# Patient Record
Sex: Female | Born: 1985 | Race: Black or African American | Hispanic: No | Marital: Single | Smoking: Current every day smoker
Health system: Southern US, Community
[De-identification: ages and names within clinical notes are randomized; demographics above are authoritative.]

## PROBLEM LIST (undated history)

## (undated) ENCOUNTER — Inpatient Hospital Stay (HOSPITAL_COMMUNITY): Payer: Self-pay

## (undated) DIAGNOSIS — I10 Essential (primary) hypertension: Secondary | ICD-10-CM

## (undated) DIAGNOSIS — D649 Anemia, unspecified: Secondary | ICD-10-CM

## (undated) DIAGNOSIS — IMO0002 Reserved for concepts with insufficient information to code with codable children: Secondary | ICD-10-CM

## (undated) HISTORY — PX: THERAPEUTIC ABORTION: SHX798

---
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
## 2004-05-14 ENCOUNTER — Emergency Department (HOSPITAL_COMMUNITY): Admission: EM | Admit: 2004-05-14 | Discharge: 2004-05-14 | Payer: Self-pay | Admitting: Emergency Medicine

## 2004-06-11 ENCOUNTER — Emergency Department (HOSPITAL_COMMUNITY): Admission: EM | Admit: 2004-06-11 | Discharge: 2004-06-11 | Payer: Self-pay | Admitting: Emergency Medicine

## 2006-08-07 ENCOUNTER — Emergency Department (HOSPITAL_COMMUNITY): Admission: EM | Admit: 2006-08-07 | Discharge: 2006-08-07 | Payer: Self-pay | Admitting: Emergency Medicine

## 2006-09-19 ENCOUNTER — Emergency Department (HOSPITAL_COMMUNITY): Admission: EM | Admit: 2006-09-19 | Discharge: 2006-09-19 | Payer: Self-pay | Admitting: Family Medicine

## 2006-10-20 ENCOUNTER — Emergency Department (HOSPITAL_COMMUNITY): Admission: EM | Admit: 2006-10-20 | Discharge: 2006-10-20 | Payer: Self-pay | Admitting: Family Medicine

## 2007-08-20 ENCOUNTER — Inpatient Hospital Stay (HOSPITAL_COMMUNITY): Admission: AD | Admit: 2007-08-20 | Discharge: 2007-08-20 | Payer: Self-pay | Admitting: Obstetrics and Gynecology

## 2007-08-26 ENCOUNTER — Inpatient Hospital Stay (HOSPITAL_COMMUNITY): Admission: AD | Admit: 2007-08-26 | Discharge: 2007-08-26 | Payer: Self-pay | Admitting: Obstetrics and Gynecology

## 2007-11-08 ENCOUNTER — Emergency Department (HOSPITAL_COMMUNITY): Admission: EM | Admit: 2007-11-08 | Discharge: 2007-11-08 | Payer: Self-pay | Admitting: Family Medicine

## 2008-01-24 ENCOUNTER — Inpatient Hospital Stay (HOSPITAL_COMMUNITY): Admission: AD | Admit: 2008-01-24 | Discharge: 2008-01-24 | Payer: Self-pay | Admitting: Obstetrics and Gynecology

## 2008-04-19 ENCOUNTER — Inpatient Hospital Stay (HOSPITAL_COMMUNITY): Admission: AD | Admit: 2008-04-19 | Discharge: 2008-04-22 | Payer: Self-pay | Admitting: Obstetrics and Gynecology

## 2008-04-19 ENCOUNTER — Encounter (INDEPENDENT_AMBULATORY_CARE_PROVIDER_SITE_OTHER): Payer: Self-pay | Admitting: Obstetrics and Gynecology

## 2008-06-01 ENCOUNTER — Emergency Department (HOSPITAL_COMMUNITY): Admission: EM | Admit: 2008-06-01 | Discharge: 2008-06-01 | Payer: Self-pay | Admitting: Emergency Medicine

## 2008-08-02 ENCOUNTER — Emergency Department (HOSPITAL_COMMUNITY): Admission: EM | Admit: 2008-08-02 | Discharge: 2008-08-02 | Payer: Self-pay | Admitting: Family Medicine

## 2008-09-15 ENCOUNTER — Emergency Department (HOSPITAL_COMMUNITY): Admission: EM | Admit: 2008-09-15 | Discharge: 2008-09-15 | Payer: Self-pay | Admitting: Family Medicine

## 2008-11-20 ENCOUNTER — Emergency Department (HOSPITAL_COMMUNITY): Admission: EM | Admit: 2008-11-20 | Discharge: 2008-11-20 | Payer: Self-pay | Admitting: Emergency Medicine

## 2009-08-22 ENCOUNTER — Emergency Department (HOSPITAL_COMMUNITY): Admission: EM | Admit: 2009-08-22 | Discharge: 2009-08-22 | Payer: Self-pay | Admitting: Emergency Medicine

## 2009-10-14 ENCOUNTER — Emergency Department (HOSPITAL_COMMUNITY): Admission: EM | Admit: 2009-10-14 | Discharge: 2009-10-14 | Payer: Self-pay | Admitting: Family Medicine

## 2010-02-12 ENCOUNTER — Emergency Department (HOSPITAL_COMMUNITY): Admission: EM | Admit: 2010-02-12 | Discharge: 2010-02-12 | Payer: Self-pay | Admitting: Family Medicine

## 2010-03-09 ENCOUNTER — Emergency Department (HOSPITAL_COMMUNITY): Admission: EM | Admit: 2010-03-09 | Discharge: 2010-03-09 | Payer: Self-pay | Admitting: Family Medicine
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
## 2010-07-01 ENCOUNTER — Emergency Department (HOSPITAL_COMMUNITY): Admission: EM | Admit: 2010-07-01 | Discharge: 2010-07-01 | Payer: Self-pay | Admitting: Family Medicine

## 2010-11-19 ENCOUNTER — Emergency Department (HOSPITAL_COMMUNITY)
Admission: EM | Admit: 2010-11-19 | Discharge: 2010-11-19 | Disposition: A | Discharge status: Home / Self Care | Payer: Medicaid Other | Attending: Emergency Medicine | Admitting: Emergency Medicine

## 2010-11-19 DIAGNOSIS — G5690 Unspecified mononeuropathy of unspecified upper limb: Secondary | ICD-10-CM | POA: Insufficient documentation

## 2010-11-19 DIAGNOSIS — R197 Diarrhea, unspecified: Secondary | ICD-10-CM | POA: Insufficient documentation

## 2010-11-19 DIAGNOSIS — K029 Dental caries, unspecified: Secondary | ICD-10-CM | POA: Insufficient documentation

## 2010-11-19 DIAGNOSIS — K089 Disorder of teeth and supporting structures, unspecified: Secondary | ICD-10-CM | POA: Insufficient documentation

## 2010-11-19 DIAGNOSIS — K5289 Other specified noninfective gastroenteritis and colitis: Secondary | ICD-10-CM | POA: Insufficient documentation

## 2010-11-19 DIAGNOSIS — M79609 Pain in unspecified limb: Secondary | ICD-10-CM | POA: Insufficient documentation

## 2010-11-19 DIAGNOSIS — R109 Unspecified abdominal pain: Secondary | ICD-10-CM | POA: Insufficient documentation

## 2010-11-19 DIAGNOSIS — R112 Nausea with vomiting, unspecified: Secondary | ICD-10-CM | POA: Insufficient documentation

## 2010-11-19 DIAGNOSIS — B9789 Other viral agents as the cause of diseases classified elsewhere: Secondary | ICD-10-CM | POA: Insufficient documentation

## 2010-11-19 LAB — URINALYSIS, ROUTINE W REFLEX MICROSCOPIC
Bilirubin Urine: NEGATIVE
Glucose, UA: NEGATIVE mg/dL
Hgb urine dipstick: NEGATIVE
Ketones, ur: 15 mg/dL — AB
Nitrite: NEGATIVE
Protein, ur: NEGATIVE mg/dL
Specific Gravity, Urine: 1.031 — ABNORMAL HIGH (ref 1.005–1.030)
Urobilinogen, UA: 1 mg/dL (ref 0.0–1.0)
pH: 6 (ref 5.0–8.0)

## 2010-11-19 LAB — POCT PREGNANCY, URINE: Preg Test, Ur: NEGATIVE

## 2010-12-19 NOTE — Op Note (Signed)
Christina [name], Christina [name]                 ACCOUNT NO.:  [id number]   MEDICAL RECORD NO.:  [id number]          PATIENT TYPE:  INP   LOCATION:  9104                          FACILITY:  [facility]   PHYSICIAN:  Malachi Pro. Ambrose Mantle, M.D. DATE OF BIRTH:  1985/10/13   DATE OF PROCEDURE:  04/19/2008  DATE OF DISCHARGE:                               OPERATIVE REPORT   PREOPERATIVE DIAGNOSES:  Intrauterine pregnancy 40 plus weeks, failure  to progress in labor.   POSTOPERATIVE DIAGNOSES:  Intrauterine pregnancy 40 plus weeks, failure  to progress in labor with small fibroids on the uterus.   OPERATION:  Low-transverse cervical cesarean section.   OPERATOR:  Malachi Pro. Ambrose Mantle, MD   ANESTHESIA:  Epidural anesthesia.   DESCRIPTION OF PROCEDURE:  The patient was brought to the operating room  and for approximately 20 to 25 minutes we waited for the epidural to set  up.  It never did set up so Dr. Jean Rosenthal came and gave another epidural  and very quickly the patient became anesthetized.  The abdomen was  prepped with Betadine solution and draped as a sterile field.  A Foley  catheter was indwelling.  Anesthesia was confirmed.  A transverse  incision was made and carried in layers through the skin, subcutaneous  tissue, and fascia.  There was bleeding from several vessels in the  subcu tissue as well as in the rectus muscle.  The fascia was incised  transversely, separated from the rectus muscles superiorly and  inferiorly.  The peritoneum was opened vertically and then was enlarged  more by pulling.  The lower uterine segment was exposed.  A small  incision was made in the superficial portion of the myometrium in a  transverse direction, and then, the rest of the way I went with my  finger and I then divided the myometrium upward and outward with the  bandage scissors.  Several loops of cord came out through the incision.  I elevated the vertex through the incisional opening and took a  considerable amount of  fundal pressure and manipulation to get both the  head and the shoulders out, but there was no trauma to the baby.  The  cord was clamped.  The infant was given to Dr. Jiles Crocker who was an  attendance.  He assigned the 8-pound 11-ounce female infant, Apgars of 9  at one and 9 at five minutes.  The placenta was removed.  All the  membranes were removed.  The inside of the uterus was inspected and  found to be free of any remaining debris and the uterine incision was  then closed in layers using a running locked suture of 0 Vicryl on the  first layer, nonlocking suture of the same material on the second layer.  Hemostasis was adequate except for a couple of small spots that required  Bovie and I did take one extra figure-of-eight suture in the middle of  the myometrium.  There was few small less than 1 cm fibroids on the  anterior surface of the uterus.  Both tubes and ovaries appeared  completely  normal.  The gutters were blotted free of blood.  Liberal  irrigation confirmed hemostasis.  The abdominal wall was then closed  with interrupted sutures of 0 Vicryl including  the rectus muscle and the peritoneum then 2 running sutures of 0 Vicryl  in the fascia, running 3-0 Vicryl in the subcu tissue, and staples on  the skin.  The patient seemed to tolerate the procedure well.  Blood  loss was less than 1000 mL.  Sponge and needle counts were correct, and  she was returned to recovery in satisfactory condition.      Malachi Pro. Ambrose Mantle, M.D.  Electronically Signed     TFH/MEDQ  D:  04/20/2008  T:  04/20/2008  Job:  [id number]

## 2010-12-19 NOTE — Discharge Summary (Signed)
Christina [name], Christina [name]                 ACCOUNT NO.:  [id number]   MEDICAL RECORD NO.:  [id number]          PATIENT TYPE:  INP   LOCATION:  9104                          FACILITY:  [facility]   PHYSICIAN:  Malachi Pro. Ambrose Mantle, M.D. DATE OF BIRTH:  04-20-1986   DATE OF ADMISSION:  04/19/2008  DATE OF DISCHARGE:  04/22/2008                               DISCHARGE SUMMARY   This is a 25 year old black single female para 0, gravida 1, admitted  with early labor.  Blood group and type O positive, negative antibody,  sickle cell negative, RPR nonreactive, rubella equivocal.  Hepatitis B  surface antigen negative.  HIV negative.  GC and chlamydia negative.  Quad screen negative.  A 1-hour Glucola 156.  A 3-hour GTT 77, 153, 120,  and 85.  Group B strep positive.  Prenatal course is documented in the  history and physical.  The patient came to the hospital, was placed on  Pitocin at 4 cm dilatation.  She over approximately 10 hours was unable  to progress beyond 4-5 cm and underwent a low-transverse cervical C-  section for failure to progress of with delivery of an 8 pounds 11  ounces female infant.  Apgars were 9 at 9 minutes and 1 at 5 minutes.  Postpartum, the patient did quite well and ambulated well without  difficulty, voided well, tolerated a diet, passed flatus and on the  third postop day was ready for discharge.   Initial hemoglobin 13.6, hematocrit 41.3, white count 7100, platelet  count of 161,000.  Followup hemoglobin 9.9, platelet count of 111,00.  RPR nonreactive.  Urinalysis was negative for protein.  Comprehensive  metabolic profile to check any signs of preeclampsia was negative.  AST  and ALT were 24 and 16 respectively.  LDH was 162.  Uric acid 0.5.   FINAL DIAGNOSES:  Intrauterine pregnancy at 40 weeks, delivered vertex  by C-section for failure to progress in labor.   OPERATION:  Low transverse cervical C-section.   FINAL CONDITION:  Improved.   INSTRUCTIONS:  Our regular  discharge instruction booklet.  The patient  is advised to return to the office in 10-14 days for followup  examination.  She is advised to try to go to the health department and  get a rubella vaccine. Percocet 5/325 mg #30 tablets 1 every 4-6 hours  as needed for pain and Motrin 600 mg #30 tablets 1 every 6 hours as  needed.      Malachi Pro. Ambrose Mantle, M.D.  Electronically Signed     TFH/MEDQ  D:  04/22/2008  T:  04/22/2008  Job:  [id number]

## 2010-12-19 NOTE — H&P (Signed)
Christina [name], Christina [name]                 ACCOUNT NO.:  [id number]   MEDICAL RECORD NO.:  [id number]          PATIENT TYPE:  INP   LOCATION:  9104                          [facility]ACILITY:  [facility]   PHYSICIAN:  Malachi Pro. Ambrose Mantle, M.D. DATE OF BIRTH:  1985-11-29   DATE OF ADMISSION:  04/19/2008  DATE OF DISCHARGE:                              HISTORY & PHYSICAL   A 25 year old black single female, para 0, gravida 1, [facility] April 18, 2008 admitted with early labor.  Blood group and type O positive,  negative antibody, sickle cell negative, RPR nonreactive, rubella  equivocal.  Hepatitis B surface antigen negative, HIV negative, GC and  Chlamydia negative.  Quad screen negative, 1-hour Glucola 156,  3-hour  GTT 77, 153, 120, and 85.  Group B strep positive.  Vaginal ultrasound  on September 17, 2007.  Crown-rump length 2.6 cm, 9 weeks 3 days, [facility]  April 18, 2008.  Ultrasound on November 25, 2007, gestational age [redacted]  weeks 2 days, [facility] April 22, 2008.  Prenatal care was uncomplicated.  At 4:30 a.m. on the day of admission, the patient began having painful  contractions.  They were 5 to 30 minutes apart.  She came to our office  at noon and was examined and found to be 4 cm dilated.  She came to the  hospital, was admitted for contractions were not every 2 minutes ranging  from 2-4 minutes apart so she was started on Pitocin and received an  epidural.   PAST MEDICAL HISTORY:  No known allergies.  No operations.  No  illnesses.   ALCOHOL, TOBACCO AND DRUGS:  One to two cigarettes a day.   FAMILY HISTORY:  Mother with high blood pressure.   PHYSICAL EXAMINATION:  VITAL SIGNS:  On admission, blood pressure  slightly elevated 147/91, temperature 98, respirations 16, pulse 80.  HEART:  Normal sinus rhythm.  No murmurs.  LUNGS:  Lungs were clear to auscultation.  ABDOMEN:  Soft.  Fundal height 42 cm on April 06, 2008.  Fetal heart  tones were normal.  PELVIC:  My exam at 5:55 p.m., the cervix was 5  cm with a bulging bag of  waters, 80% effaced with the vertex minus 5.  Artificial rupture of  membranes produced clear fluid.  Vertex descended to a minus 3.  Now  after 4 hours of good contractions 2 minutes apart with Pitocin at 12  milliunits a minute.  The cervix remains 4 cm, 80% vertex at minus 3.  Will proceed with C-section for failure to progress in labor.  PIH labs  were done on admission and were normal.      Malachi Pro. Ambrose Mantle, M.D.  Electronically Signed     TFH/MEDQ  D:  04/19/2008  T:  04/20/2008  Job:  [id number]

## 2011-04-26 LAB — URINALYSIS, ROUTINE W REFLEX MICROSCOPIC
Glucose, UA: NEGATIVE
Ketones, ur: >80 — AB
Leukocytes, UA: NEGATIVE
Nitrite: NEGATIVE
Protein, ur: 30 — AB
Specific Gravity, Urine: >1.03 — ABNORMAL HIGH
Urobilinogen, UA: 0.2
pH: 6

## 2011-04-26 LAB — URINE MICROSCOPIC-ADD ON

## 2011-04-27 LAB — POCT PREGNANCY, URINE
Operator id: 25114
Preg Test, Ur: POSITIVE

## 2011-04-27 LAB — CBC
HCT: 33.7 — ABNORMAL LOW
Hemoglobin: 11.2 — ABNORMAL LOW
MCHC: 33.3
MCV: 81.3
Platelets: 287
RBC: 4.15
RDW: 19.2 — ABNORMAL HIGH
WBC: 6.9

## 2011-04-27 LAB — GC/CHLAMYDIA PROBE AMP, GENITAL
Chlamydia, DNA Probe: POSITIVE — AB
GC Probe Amp, Genital: NEGATIVE

## 2011-04-27 LAB — WET PREP, GENITAL
Clue Cells Wet Prep HPF POC: NONE SEEN
Trich, Wet Prep: NONE SEEN
Yeast Wet Prep HPF POC: NONE SEEN

## 2011-04-27 LAB — ABO/RH: ABO/RH(D): O POS

## 2011-05-03 LAB — URINALYSIS, ROUTINE W REFLEX MICROSCOPIC
Bilirubin Urine: NEGATIVE
Glucose, UA: NEGATIVE
Hgb urine dipstick: NEGATIVE
Ketones, ur: NEGATIVE
Nitrite: NEGATIVE
Protein, ur: NEGATIVE
Specific Gravity, Urine: 1.025
Urobilinogen, UA: 0.2
pH: 7

## 2011-05-03 LAB — CBC
HCT: 32.8 — ABNORMAL LOW
Hemoglobin: 11.1 — ABNORMAL LOW
MCHC: 33.9
MCV: 92.7
Platelets: 178
RBC: 3.54 — ABNORMAL LOW
RDW: 14.9
WBC: 7.1

## 2011-05-03 LAB — COMPREHENSIVE METABOLIC PANEL
ALT: 15
AST: 19
Albumin: 2.5 — ABNORMAL LOW
Alkaline Phosphatase: 36 — ABNORMAL LOW
BUN: 2 — ABNORMAL LOW
CO2: 25
Calcium: 8.4
Chloride: 106
Creatinine, Ser: 0.4
GFR calc Af Amer: >60
GFR calc non Af Amer: >60
Glucose, Bld: 83
Potassium: 3.7
Sodium: 135
Total Bilirubin: 0.3
Total Protein: 5.5 — ABNORMAL LOW

## 2011-05-03 LAB — URINE MICROSCOPIC-ADD ON

## 2011-05-03 LAB — URIC ACID: Uric Acid, Serum: 3.3

## 2011-05-03 LAB — LACTATE DEHYDROGENASE: LDH: 108

## 2011-05-07 LAB — CBC
HCT: 30 — ABNORMAL LOW
Hemoglobin: 9.9 — ABNORMAL LOW
MCHC: 33.1
MCV: 93.6
Platelets: 111 — ABNORMAL LOW
RBC: 3.2 — ABNORMAL LOW
RDW: 15.2
WBC: 9.3

## 2011-05-09 LAB — COMPREHENSIVE METABOLIC PANEL
AST: 24
Albumin: 2.8 — ABNORMAL LOW
Alkaline Phosphatase: 93
BUN: 4 — ABNORMAL LOW
GFR calc Af Amer: >60
Potassium: 4.4
Total Protein: 6

## 2011-05-09 LAB — CBC
MCV: 92.5
Platelets: 161
WBC: 7.1

## 2011-05-09 LAB — URINALYSIS, DIPSTICK ONLY
Glucose, UA: NEGATIVE
Specific Gravity, Urine: 1.01

## 2011-05-09 LAB — RPR: RPR Ser Ql: NONREACTIVE

## 2011-05-09 LAB — URIC ACID: Uric Acid, Serum: 4.5

## 2012-01-31 ENCOUNTER — Encounter (HOSPITAL_COMMUNITY): Payer: Self-pay | Admitting: *Deleted

## 2012-01-31 ENCOUNTER — Emergency Department (HOSPITAL_COMMUNITY)
Admission: EM | Admit: 2012-01-31 | Discharge: 2012-01-31 | Disposition: A | Discharge status: Home / Self Care | Payer: Self-pay | Source: Home / Self Care | Attending: Emergency Medicine | Admitting: Emergency Medicine

## 2012-01-31 DIAGNOSIS — M7750 Other enthesopathy of unspecified foot: Secondary | ICD-10-CM

## 2012-01-31 DIAGNOSIS — M659 Synovitis and tenosynovitis, unspecified: Secondary | ICD-10-CM

## 2012-01-31 HISTORY — DX: Essential (primary) hypertension: I10

## 2012-01-31 MED ORDER — MELOXICAM 7.5 MG PO TABS: 7.5000 mg | ORAL_TABLET | Freq: Every day | ORAL | Status: AC

## 2012-01-31 NOTE — ED Notes (Signed)
3 days of left ankle/foot pain-  Pt denies recent injury.  Pt ambulated in the exam room with a limp   She has not taken any treatments for this

## 2012-01-31 NOTE — Discharge Instructions (Signed)
Followup with a foot Dr. if discomfort persists beyond one to 2 weeks.   Tendinitis and Tenosynovitis  Tendinitis is inflammation of the tendon. Tenosynovitis is inflammation of the lining around the tendon (tendon sheath). These painful conditions often occur at once. Tendons attach muscle to bone. To move a limb, force from the muscle moves through the tendon, to the bone. These conditions often cause increased pain when moving. Tendinitis may be caused by a small or partial tear in the tendon.  SYMPTOMS   Pain, tenderness, redness, bruising, or swelling at the injury.   Loss of normal joint movement.   Pain that gets worse with use of the muscle and joint attached to the tendon.   Weakness in the tendon, caused by calcium build up that may occur with tendinitis.   Commonly affected tendons:   Achilles tendon (calf of leg).   Rotator cuff (shoulder joint).   Patellar tendon (kneecap to shin).   Peroneal tendon (ankle).   Posterior tibial tendon (inner ankle).   Biceps tendon (in front of shoulder).  CAUSES   Sudden strain on a flexed muscle, muscle overuse, sudden increase or change in activity, vigorous activity.   Result of a direct hit (less common).   Poor muscle action (biomechanics).  RISK INCREASES WITH:  Injury (trauma).   Too much exercise.   Sudden change in athletic activity.   Incorrect exercise form or technique.   Poor strength and flexibility.   Not warming-up properly before activity.   Returning to activity before healing is complete.  PREVENTION   Warm-up and stretch properly before activity.   Maintain physical fitness:   Joint flexibility.   Muscle strength and endurance.   Fitness that increases heart rate.   Learn and use proper exercise techniques.   Use rehabilitation exercises to strengthen weak muscles and tendons.   Ice the tendon after activity, to reduce recurring inflammation.   Wear proper fitting protective  equipment for specific tendons, when indicated.  PROGNOSIS  When treated properly, can be cured in 6 to 8 weeks. Recovery may take longer, depending on degree of injury.  RELATED COMPLICATIONS   Re-injury or recurring symptoms.   Permanent weakness or joint stiffness, if injury is severe and recovery is not completed.   Delayed healing, if sports are started before healing is complete.   Tearing apart (rupture) of the inflamed tendon. Tendinitis means the tendon is injured and must recover.  TREATMENT  Treatment first involves ice, medicine, and rest from aggravating activities. This reduces pain and inflammation. Modifying your activity may be considered to prevent recurring injury. A brace, elastic bandage wrap, splint, cast, or sling may be prescribed to protect the joint for a short period. After that period, strengthening and stretching exercise may help to regain strength and full range of motion. If the condition persists, despite non-surgical treatment, surgery may be recommended to remove the inflamed tendon lining. Corticosteroid injections may be given to reduce inflammation. However, these injections may weaken the tendon and increase your risk for tendon rupture. MEDICATION   If pain medicine is needed, nonsteroidal anti-inflammatory medicines (aspirin and ibuprofen), or other minor pain relievers (acetaminophen), are often recommended.   Do not take pain medicine for 7 days before surgery.   Prescription pain relievers are usually prescribed only after surgery. Use only as directed and only as much as you need.   Ointments applied to the skin may be helpful.   Corticosteroid injections may be given to reduce inflammation. However,  this may increase your risk of a tendon rupture.  HEAT AND COLD  Cold treatment (icing) relieves pain and reduces inflammation. Cold treatment should be applied for 10 to 15 minutes every 2 to 3 hours, and immediately after activity that aggravates  your symptoms. Use ice packs or an ice massage.   Heat treatment may be used before performing stretching and strengthening activities prescribed by your caregiver, physical therapist, or athletic trainer. Use a heat pack or a warm water soak.  SEEK MEDICAL CARE IF:   Symptoms get worse or do not improve, despite treatment.   Pain becomes too much to tolerate.   You develop numbness or tingling.   Toes become cold, or toenails become blue, gray, or dark colored.   New, unexplained symptoms develop. (Drugs used in treatment may produce side effects.)  Document Released: 07/23/2005 Document Revised: 07/12/2011 Document Reviewed: 11/04/2008 [facility] Patient Information 2012 [place], [place].

## 2012-01-31 NOTE — ED Provider Notes (Signed)
History     CSN: [id number]  Arrival date & time 01/31/12  1650   First MD Initiated Contact with Patient 01/31/12 1703      Chief Complaint  Patient presents with  . Ankle Pain    (Consider location/radiation/quality/duration/timing/severity/associated sxs/prior treatment) HPI Comments: Patient presents urgent care complaining of 3 days of left foot pain. (Patient points to the dorsal aspect of left foot), " I have not had any falls or injuries or twisted or sprained injuries to my ankle or foot "patient denies any tingling, numbness or distal weakness of her foot. Denies any constitutional symptoms such as fevers, generalized malaise or arthralgias. Patient describes also that she has had similar pains in the past before and that her mother has mentioned to her that type of shoes that she wears might be a contributing factor. She agrees somewhat with that but wanted to be checked.   Patient is a 26 y.o. female presenting with ankle pain. The history is provided by the patient.  Ankle Pain  The incident occurred more than 2 days ago. The incident occurred at home. There was no injury mechanism. The pain is present in the left foot. The pain is at a severity of 5/10. The patient is experiencing no pain. The pain has been constant since onset. Pertinent negatives include no numbness, no loss of motion and no loss of sensation. She reports no foreign bodies present. She has tried nothing for the symptoms. The treatment provided no relief.    Past Medical History  Diagnosis Date  . Hypertension     Past Surgical History  Procedure Date  . Cesarean section     History reviewed. No pertinent family history.  History  Substance Use Topics  . Smoking status: Current Everyday Smoker    Types: Cigarettes  . Smokeless tobacco: Not on file  . Alcohol Use: Yes     occasionally    OB History    Grav Para Term Preterm Abortions TAB SAB Ect Mult Living                  Review of  Systems  Constitutional: Positive for activity change. Negative for fever, chills, appetite change and fatigue.  Musculoskeletal: Negative for myalgias, back pain and joint swelling.  Skin: Negative for color change, pallor and wound.  Neurological: Negative for dizziness, syncope, weakness and numbness.    Allergies  Review of patient's allergies indicates no known allergies.  Home Medications   Current Outpatient Rx  Name Route Sig Dispense Refill  . LISINOPRIL 10 MG PO TABS Oral Take 10 mg by mouth daily.    . MELOXICAM 7.5 MG PO TABS Oral Take 1 tablet (7.5 mg total) by mouth daily. 14 tablet 0    BP 135/87  Pulse 90  Temp 98.7 F (37.1 C) (Oral)  Resp 18  SpO2 99%  LMP 01/22/2012  Physical Exam  Nursing note and vitals reviewed. Constitutional: Vital signs are normal. She appears well-developed and well-nourished.  Non-toxic appearance. She does not have a sickly appearance. She does not appear ill. No distress.  Abdominal: There is no tenderness.  Musculoskeletal: She exhibits tenderness. She exhibits no edema.       Left ankle: She exhibits normal range of motion, no swelling and no ecchymosis. tenderness. No lateral malleolus, no medial malleolus, no AITFL, no CF ligament, no posterior TFL, no head of 5th metatarsal and no proximal fibula tenderness found. Achilles tendon normal.  Feet:  Neurological: She is alert.  Skin: No rash noted. No erythema.    ED Course  Procedures (including critical care time)  Labs Reviewed - No data to display No results found.   1. Tendinitis of foot       MDM  Left tenosynovitis of the foot. No trauma or injury. No erythema, no neural vascular deficits. Patient was prescribed a Cox 2 anti-inflammatory medicine and recommended to followup with a [facility]. If no improvement within the next 10-14 days. Patient agree and to followup and understood instructions for future care if pain persisted.        Jimmie Molly, MD 01/31/12 2010

## 2013-07-10 ENCOUNTER — Encounter (HOSPITAL_COMMUNITY): Payer: Self-pay | Admitting: *Deleted

## 2013-07-10 ENCOUNTER — Inpatient Hospital Stay (HOSPITAL_COMMUNITY)
Admission: AD | Admit: 2013-07-10 | Discharge: 2013-07-10 | Disposition: A | Discharge status: Home / Self Care | Payer: Medicaid Other | Source: Ambulatory Visit | Attending: Family Medicine | Admitting: Family Medicine

## 2013-07-10 ENCOUNTER — Inpatient Hospital Stay (HOSPITAL_COMMUNITY): Payer: Medicaid Other

## 2013-07-10 DIAGNOSIS — B9689 Other specified bacterial agents as the cause of diseases classified elsewhere: Secondary | ICD-10-CM | POA: Insufficient documentation

## 2013-07-10 DIAGNOSIS — R109 Unspecified abdominal pain: Secondary | ICD-10-CM | POA: Insufficient documentation

## 2013-07-10 DIAGNOSIS — O9989 Other specified diseases and conditions complicating pregnancy, childbirth and the puerperium: Secondary | ICD-10-CM

## 2013-07-10 DIAGNOSIS — O2390 Unspecified genitourinary tract infection in pregnancy, unspecified trimester: Secondary | ICD-10-CM | POA: Insufficient documentation

## 2013-07-10 DIAGNOSIS — N760 Acute vaginitis: Secondary | ICD-10-CM | POA: Insufficient documentation

## 2013-07-10 DIAGNOSIS — A499 Bacterial infection, unspecified: Secondary | ICD-10-CM | POA: Insufficient documentation

## 2013-07-10 DIAGNOSIS — O26899 Other specified pregnancy related conditions, unspecified trimester: Secondary | ICD-10-CM

## 2013-07-10 HISTORY — DX: Anemia, unspecified: D64.9

## 2013-07-10 LAB — CBC
HCT: 35 % — ABNORMAL LOW (ref 36.0–46.0)
MCV: 85.2 fL (ref 78.0–100.0)
RBC: 4.11 MIL/uL (ref 3.87–5.11)
WBC: 5 10*3/uL (ref 4.0–10.5)

## 2013-07-10 LAB — WET PREP, GENITAL: Yeast Wet Prep HPF POC: NONE SEEN

## 2013-07-10 LAB — URINALYSIS, ROUTINE W REFLEX MICROSCOPIC
Glucose, UA: NEGATIVE mg/dL
Leukocytes, UA: NEGATIVE
Specific Gravity, Urine: 1.02 (ref 1.005–1.030)
pH: 6.5 (ref 5.0–8.0)

## 2013-07-10 MED ORDER — METRONIDAZOLE 500 MG PO TABS: 500.0000 mg | ORAL_TABLET | Freq: Two times a day (BID) | ORAL | Status: DC

## 2013-07-10 NOTE — MAU Note (Signed)
spotting a wk ago, no bleeding today.

## 2013-07-10 NOTE — MAU Provider Note (Signed)
History     CSN: [id number]  Arrival date and time: 07/10/13 1300   First Provider Initiated Contact with Patient 07/10/13 1414      Chief Complaint  Patient presents with  . Abdominal Pain  . Possible Pregnancy  . Vaginal Discharge   HPI Christina [name] is a 27 y.o. [phone] at Unknown who presents to MAU today with complaint of abdominal pain and vaginal discharge. The patient states that she had +HPT recently. She has had lower abdominal pain bilaterally off and on since then. She reports LMP was 05/06/13. She is also having a thick, white vaginal discharge. She has nausea without vomiting, diarrhea or constipation. She denies vaginal bleeding, dizziness, fever or UTI symptoms.   OB History   Grav Para Term Preterm Abortions TAB SAB Ect Mult Living   5 1 1  0 2 2 0 0 0 1      Past Medical History  Diagnosis Date  . Hypertension     quit medicine a yr ago because of cough  . Anemia     Past Surgical History  Procedure Laterality Date  . Cesarean section    . Therapeutic abortion      Family History  Problem Relation Age of Onset  . Hypertension Mother   . Diabetes Mother   . Hyperlipidemia Father     heart attack  . Hypertension Father   . Cancer Maternal Grandmother     lung  . Cancer Maternal Grandfather     prostate    History  Substance Use Topics  . Smoking status: Current Every Day Smoker -- 0.50 packs/day for 10 years    Types: Cigarettes  . Smokeless tobacco: Never Used  . Alcohol Use: Yes     Comment: 1-2 a wk prior to preg    Allergies: No Known Allergies  No prescriptions prior to admission    Review of Systems  Constitutional: Negative for fever and malaise/fatigue.  Gastrointestinal: Positive for nausea and abdominal pain. Negative for vomiting, diarrhea and constipation.  Genitourinary: Negative for dysuria, urgency and frequency.       Neg - vaginal bleeding + vaginal discharge  Neurological: Negative for dizziness, loss of  consciousness and weakness.   Physical Exam   Blood pressure 132/75, pulse 93, temperature 98.6 F (37 C), temperature source Oral, resp. rate 18, height 5\' 5"  (1.651 m), weight 187 lb (84.823 kg), last menstrual period 05/06/2013, SpO2 100.00%.  Physical Exam  Constitutional: She is oriented to person, place, and time. She appears well-developed and well-nourished. No distress.  HENT:  Head: Normocephalic and atraumatic.  Cardiovascular: Normal rate, regular rhythm and normal heart sounds.   Respiratory: Effort normal and breath sounds normal. No respiratory distress.  GI: Soft. Bowel sounds are normal. She exhibits no distension and no mass. There is tenderness (mild lower abdominal tenderness to palpation). There is no rebound and no guarding.  Genitourinary: Uterus is not enlarged and not tender. Cervix exhibits no motion tenderness, no discharge and no friability. Right adnexum displays no mass and no tenderness. Left adnexum displays no mass and no tenderness. No bleeding around the vagina. Vaginal discharge (scant thick, white discharge noted) found.  Neurological: She is alert and oriented to person, place, and time.  Skin: Skin is warm and dry. No erythema.  Psychiatric: She has a normal mood and affect.   Results for orders placed during the hospital encounter of 07/10/13 (from the past 24 hour(s))  CBC  Status: Abnormal   Collection Time    07/10/13  1:23 PM      Result Value Range   WBC 5.0  4.0 - 10.5 K/uL   RBC 4.11  3.87 - 5.11 MIL/uL   Hemoglobin 11.8 (*) 12.0 - 15.0 g/dL   HCT 16.1 (*) 09.6 - 04.5 %   MCV 85.2  78.0 - 100.0 fL   MCH 28.7  26.0 - 34.0 pg   MCHC 33.7  30.0 - 36.0 g/dL   RDW 40.9 (*) 81.1 - 91.4 %   Platelets 196  150 - 400 K/uL  ABO/RH     Status: None   Collection Time    07/10/13  1:23 PM      Result Value Range   ABO/RH(D) O POS    HCG, QUANTITATIVE, PREGNANCY     Status: Abnormal   Collection Time    07/10/13  1:28 PM      Result Value  Range   hCG, Beta Chain, Quant, S 68000 (*) <5 mIU/mL  URINALYSIS, ROUTINE W REFLEX MICROSCOPIC     Status: None   Collection Time    07/10/13  2:04 PM      Result Value Range   Color, Urine YELLOW  YELLOW   APPearance CLEAR  CLEAR   Specific Gravity, Urine 1.020  1.005 - 1.030   pH 6.5  5.0 - 8.0   Glucose, UA NEGATIVE  NEGATIVE mg/dL   Hgb urine dipstick NEGATIVE  NEGATIVE   Bilirubin Urine NEGATIVE  NEGATIVE   Ketones, ur NEGATIVE  NEGATIVE mg/dL   Protein, ur NEGATIVE  NEGATIVE mg/dL   Urobilinogen, UA 0.2  0.0 - 1.0 mg/dL   Nitrite NEGATIVE  NEGATIVE   Leukocytes, UA NEGATIVE  NEGATIVE  WET PREP, GENITAL     Status: Abnormal   Collection Time    07/10/13  2:18 PM      Result Value Range   Yeast Wet Prep HPF POC NONE SEEN  NONE SEEN   Trich, Wet Prep NONE SEEN  NONE SEEN   Clue Cells Wet Prep HPF POC FEW (*) NONE SEEN   WBC, Wet Prep HPF POC FEW (*) NONE SEEN   US Ob Comp Less 14 Wks  07/10/2013   CLINICAL DATA:  Vaginal bleeding. Nine weeks and 5 days pregnant by last menstrual period.  EXAM: OBSTETRIC <14 WK ULTRASOUND  TECHNIQUE: Transabdominal ultrasound was performed for evaluation of the gestation as well as the maternal uterus and adnexal regions.  COMPARISON:  08/26/2007.  FINDINGS: Intrauterine gestational sac: Visualized/normal in shape.  Yolk sac:  Visualized/normal in shape  Embryo:  Visualized  Cardiac Activity: Visualized  Heart Rate: 163 Bpm  CRL:   30.7  mm   10 w 0 d                  [place] EDC: 02/05/2014  Maternal uterus/adnexae: Unremarkable.  No subchorionic hemorrhage.  IMPRESSION: Single live intrauterine gestation with an estimated gestational age of [redacted] weeks and 0 days. No complicating features.   Electronically Signed   By: Gordan Payment M.D.   On: 07/10/2013 15:40    MAU Course  Procedures None  MDM UA, Wet prep, GC/Chlamydia, CBC, quant hCG and [place] today  Assessment and Plan  A: SIUP at [redacted]w[redacted]d with normal cardiac activity Bacterial  vaginosis  P: Discharge home Rx for Flagyl sent to patient's pharmacy First trimester warning signs reviewed Patient plans to go to Dr[facility] Ambrose [facility] at [facility] for  prenatal care. Patient advised to call the office to make an appointment Patient may return to MAU as needed or if her condition were to change or worsen  Freddi Starr, PA-C  07/10/2013, 4:32 PM

## 2013-07-10 NOTE — MAU Provider Note (Signed)
Chart reviewed and agree with management and plan.  

## 2013-07-10 NOTE — MAU Note (Signed)
Patient states she has had positive home pregnancy test. Has had abdominal pain off and on for several days. Has had spotting off and on, has a white vaginal discharge with odor.

## 2013-08-07 LAB — OB RESULTS CONSOLE ANTIBODY SCREEN: Antibody Screen: NEGATIVE

## 2013-08-07 LAB — OB RESULTS CONSOLE HEPATITIS B SURFACE ANTIGEN: Hepatitis B Surface Ag: NEGATIVE

## 2013-08-07 LAB — OB RESULTS CONSOLE ABO/RH: RH TYPE: POSITIVE

## 2013-10-05 LAB — OB RESULTS CONSOLE RUBELLA ANTIBODY, IGM: RUBELLA: NON-IMMUNE/NOT IMMUNE

## 2013-12-10 ENCOUNTER — Other Ambulatory Visit: Payer: Self-pay | Admitting: Obstetrics and Gynecology

## 2014-01-18 ENCOUNTER — Encounter (HOSPITAL_COMMUNITY): Payer: Self-pay | Admitting: Pharmacist

## 2014-01-25 ENCOUNTER — Encounter (HOSPITAL_COMMUNITY): Payer: Self-pay

## 2014-01-25 NOTE — Patient Instructions (Signed)
   Your procedure is scheduled on:  Friday, June 26  [facility] at: 6 AM Pick up the phone at the desk and dial [phone] and inform us of your arrival.  Please call this number if you have any problems the morning of surgery: [phone]  Remember: Do not eat or drink after midnight: Thursday Take these medicines the morning of surgery with a SIP OF WATER:  Do not wear jewelry, make-up, or FINGER nail polish No metal in your hair or on your body. Do not wear lotions, powders, perfumes.  You may wear deodorant.  Do not bring valuables to the hospital. Contacts, dentures or bridgework may not be worn into surgery.  Leave suitcase in the car. After Surgery it may be brought to your room. For patients being admitted to the hospital, checkout time is 11:00am the day of discharge.

## 2014-01-27 ENCOUNTER — Encounter (HOSPITAL_COMMUNITY)
Admission: RE | Admit: 2014-01-27 | Discharge: 2014-01-27 | Disposition: A | Discharge status: Home / Self Care | Payer: Medicaid Other | Source: Ambulatory Visit | Attending: Obstetrics and Gynecology | Admitting: Obstetrics and Gynecology

## 2014-01-27 ENCOUNTER — Encounter (HOSPITAL_COMMUNITY): Payer: Self-pay

## 2014-01-27 HISTORY — DX: Reserved for concepts with insufficient information to code with codable children: IMO0002

## 2014-01-27 LAB — COMPREHENSIVE METABOLIC PANEL
ALK PHOS: 88 U/L (ref 39–117)
ALT: 11 U/L (ref 0–35)
AST: 17 U/L (ref 0–37)
Albumin: 2.8 g/dL — ABNORMAL LOW (ref 3.5–5.2)
BUN: 4 mg/dL — AB (ref 6–23)
CALCIUM: 9.2 mg/dL (ref 8.4–10.5)
CO2: 25 mEq/L (ref 19–32)
Chloride: 100 mEq/L (ref 96–112)
Creatinine, Ser: 0.47 mg/dL — ABNORMAL LOW (ref 0.50–1.10)
GFR calc Af Amer: >90 mL/min (ref >90)
GFR calc non Af Amer: >90 mL/min (ref >90)
GLUCOSE: 74 mg/dL (ref 70–99)
Potassium: 4.1 mEq/L (ref 3.7–5.3)
Sodium: 135 mEq/L — ABNORMAL LOW (ref 137–147)
Total Bilirubin: 0.3 mg/dL (ref 0.3–1.2)
Total Protein: 6.2 g/dL (ref 6.0–8.3)

## 2014-01-27 LAB — CBC WITH DIFFERENTIAL/PLATELET
Basophils Absolute: 0 10*3/uL (ref 0.0–0.1)
Basophils Relative: 0 % (ref 0–1)
Eosinophils Absolute: 0 10*3/uL (ref 0.0–0.7)
Eosinophils Relative: 0 % (ref 0–5)
HEMATOCRIT: 35.4 % — AB (ref 36.0–46.0)
HEMOGLOBIN: 11.8 g/dL — AB (ref 12.0–15.0)
Lymphocytes Relative: 35 % (ref 12–46)
Lymphs Abs: 2.5 10*3/uL (ref 0.7–4.0)
MCH: 30.6 pg (ref 26.0–34.0)
MCHC: 33.3 g/dL (ref 30.0–36.0)
MCV: 91.7 fL (ref 78.0–100.0)
MONO ABS: 0.7 10*3/uL (ref 0.1–1.0)
Monocytes Relative: 9 % (ref 3–12)
Neutro Abs: 3.9 10*3/uL (ref 1.7–7.7)
Neutrophils Relative %: 56 % (ref 43–77)
Platelets: 198 10*3/uL (ref 150–400)
RBC: 3.86 MIL/uL — ABNORMAL LOW (ref 3.87–5.11)
RDW: 14.5 % (ref 11.5–15.5)
WBC: 7.1 10*3/uL (ref 4.0–10.5)

## 2014-01-27 LAB — RPR

## 2014-01-27 LAB — TYPE AND SCREEN
ABO/RH(D): O POS
Antibody Screen: NEGATIVE

## 2014-01-29 ENCOUNTER — Encounter (HOSPITAL_COMMUNITY): Payer: Medicaid Other | Admitting: Anesthesiology

## 2014-01-29 ENCOUNTER — Encounter (HOSPITAL_COMMUNITY)
Admission: RE | Disposition: A | Discharge status: Home / Self Care | Payer: Self-pay | Source: Ambulatory Visit | Attending: Obstetrics and Gynecology

## 2014-01-29 ENCOUNTER — Encounter (HOSPITAL_COMMUNITY): Payer: Self-pay

## 2014-01-29 ENCOUNTER — Inpatient Hospital Stay (HOSPITAL_COMMUNITY)
Admission: RE | Admit: 2014-01-29 | Discharge: 2014-01-31 | DRG: 766 | Disposition: A | Discharge status: Home / Self Care | Payer: Medicaid Other | Source: Ambulatory Visit | Attending: Obstetrics and Gynecology | Admitting: Obstetrics and Gynecology

## 2014-01-29 ENCOUNTER — Inpatient Hospital Stay (HOSPITAL_COMMUNITY): Payer: Medicaid Other | Admitting: Anesthesiology

## 2014-01-29 DIAGNOSIS — Z823 Family history of stroke: Secondary | ICD-10-CM

## 2014-01-29 DIAGNOSIS — O99892 Other specified diseases and conditions complicating childbirth: Secondary | ICD-10-CM | POA: Diagnosis present

## 2014-01-29 DIAGNOSIS — O9989 Other specified diseases and conditions complicating pregnancy, childbirth and the puerperium: Secondary | ICD-10-CM

## 2014-01-29 DIAGNOSIS — Z833 Family history of diabetes mellitus: Secondary | ICD-10-CM

## 2014-01-29 DIAGNOSIS — O99334 Smoking (tobacco) complicating childbirth: Secondary | ICD-10-CM | POA: Diagnosis present

## 2014-01-29 DIAGNOSIS — O34219 Maternal care for unspecified type scar from previous cesarean delivery: Principal | ICD-10-CM | POA: Diagnosis present

## 2014-01-29 DIAGNOSIS — Z22330 Carrier of Group B streptococcus: Secondary | ICD-10-CM

## 2014-01-29 DIAGNOSIS — Z98891 History of uterine scar from previous surgery: Secondary | ICD-10-CM

## 2014-01-29 DIAGNOSIS — Z87891 Personal history of nicotine dependence: Secondary | ICD-10-CM

## 2014-01-29 SURGERY — Surgical Case
Anesthesia: Spinal | Site: Abdomen

## 2014-01-29 MED ORDER — LACTATED RINGERS IV SOLN
Freq: Once | INTRAVENOUS | Status: AC
  Administered 2014-01-29: 07:00:00 via INTRAVENOUS

## 2014-01-29 MED ORDER — TETANUS-DIPHTH-ACELL PERTUSSIS 5-2.5-18.5 LF-MCG/0.5 IM SUSP: 0.5000 mL | Freq: Once | INTRAMUSCULAR | Status: DC

## 2014-01-29 MED ORDER — LANOLIN HYDROUS EX OINT: 1.0000 "application " | TOPICAL_OINTMENT | CUTANEOUS | Status: DC | PRN

## 2014-01-29 MED ORDER — ONDANSETRON HCL 4 MG/2ML IJ SOLN
INTRAMUSCULAR | Status: AC
  Filled 2014-01-29: qty 2

## 2014-01-29 MED ORDER — MEPERIDINE HCL 25 MG/ML IJ SOLN: 6.2500 mg | INTRAMUSCULAR | Status: DC | PRN

## 2014-01-29 MED ORDER — IBUPROFEN 600 MG PO TABS: 600.0000 mg | ORAL_TABLET | Freq: Four times a day (QID) | ORAL | Status: DC | PRN

## 2014-01-29 MED ORDER — CARBOPROST TROMETHAMINE 250 MCG/ML IM SOLN
250.0000 ug | Freq: Once | INTRAMUSCULAR | Status: AC
  Administered 2014-01-29: 250 ug via INTRAMUSCULAR

## 2014-01-29 MED ORDER — CARBOPROST TROMETHAMINE 250 MCG/ML IM SOLN
INTRAMUSCULAR | Status: AC
  Administered 2014-01-29: 250 ug via INTRAMUSCULAR
  Filled 2014-01-29: qty 1

## 2014-01-29 MED ORDER — ZOLPIDEM TARTRATE 5 MG PO TABS: 5.0000 mg | ORAL_TABLET | Freq: Every evening | ORAL | Status: DC | PRN

## 2014-01-29 MED ORDER — MIDAZOLAM HCL 2 MG/2ML IJ SOLN: 0.5000 mg | Freq: Once | INTRAMUSCULAR | Status: DC | PRN

## 2014-01-29 MED ORDER — NALOXONE HCL 1 MG/ML IJ SOLN
1.0000 ug/kg/h | INTRAVENOUS | Status: DC | PRN
  Filled 2014-01-29: qty 2

## 2014-01-29 MED ORDER — ONDANSETRON HCL 4 MG/2ML IJ SOLN
INTRAMUSCULAR | Status: DC | PRN
  Administered 2014-01-29: 4 mg via INTRAVENOUS

## 2014-01-29 MED ORDER — WITCH HAZEL-GLYCERIN EX PADS: 1.0000 "application " | MEDICATED_PAD | CUTANEOUS | Status: DC | PRN

## 2014-01-29 MED ORDER — PNEUMOCOCCAL VAC POLYVALENT 25 MCG/0.5ML IJ INJ
0.5000 mL | INJECTION | INTRAMUSCULAR | Status: DC
  Filled 2014-01-29: qty 0.5

## 2014-01-29 MED ORDER — DIBUCAINE 1 % RE OINT: 1.0000 "application " | TOPICAL_OINTMENT | RECTAL | Status: DC | PRN

## 2014-01-29 MED ORDER — ACETAMINOPHEN 500 MG PO TABS
1000.0000 mg | ORAL_TABLET | Freq: Four times a day (QID) | ORAL | Status: AC
  Administered 2014-01-29 (×3): 1000 mg via ORAL
  Filled 2014-01-29 (×3): qty 2

## 2014-01-29 MED ORDER — CEFAZOLIN SODIUM-DEXTROSE 2-3 GM-% IV SOLR
2.0000 g | Freq: Three times a day (TID) | INTRAVENOUS | Status: AC
  Administered 2014-01-29 (×2): 2 g via INTRAVENOUS
  Filled 2014-01-29 (×2): qty 50

## 2014-01-29 MED ORDER — PROMETHAZINE HCL 25 MG/ML IJ SOLN: 6.2500 mg | INTRAMUSCULAR | Status: DC | PRN

## 2014-01-29 MED ORDER — SCOPOLAMINE 1 MG/3DAYS TD PT72
1.0000 | MEDICATED_PATCH | Freq: Once | TRANSDERMAL | Status: DC
  Administered 2014-01-29: 1.5 mg via TRANSDERMAL

## 2014-01-29 MED ORDER — FENTANYL CITRATE 0.05 MG/ML IJ SOLN: 25.0000 ug | INTRAMUSCULAR | Status: DC | PRN

## 2014-01-29 MED ORDER — OXYTOCIN 10 UNIT/ML IJ SOLN
40.0000 [IU] | INTRAVENOUS | Status: DC | PRN
  Administered 2014-01-29: 40 [IU] via INTRAVENOUS

## 2014-01-29 MED ORDER — NALBUPHINE HCL 10 MG/ML IJ SOLN: 5.0000 mg | INTRAMUSCULAR | Status: DC | PRN

## 2014-01-29 MED ORDER — LACTATED RINGERS IV SOLN
INTRAVENOUS | Status: DC
  Administered 2014-01-29 (×3): via INTRAVENOUS

## 2014-01-29 MED ORDER — DIPHENHYDRAMINE HCL 25 MG PO CAPS
25.0000 mg | ORAL_CAPSULE | ORAL | Status: DC | PRN
  Filled 2014-01-29: qty 1

## 2014-01-29 MED ORDER — ONDANSETRON HCL 4 MG PO TABS: 4.0000 mg | ORAL_TABLET | ORAL | Status: DC | PRN

## 2014-01-29 MED ORDER — MENTHOL 3 MG MT LOZG: 1.0000 | LOZENGE | OROMUCOSAL | Status: DC | PRN

## 2014-01-29 MED ORDER — PHENYLEPHRINE 8 MG IN D5W 100 ML (0.08MG/ML) PREMIX OPTIME
INJECTION | INTRAVENOUS | Status: AC
  Filled 2014-01-29: qty 100

## 2014-01-29 MED ORDER — DIPHENHYDRAMINE HCL 50 MG/ML IJ SOLN: 25.0000 mg | INTRAMUSCULAR | Status: DC | PRN

## 2014-01-29 MED ORDER — FENTANYL CITRATE 0.05 MG/ML IJ SOLN
INTRAMUSCULAR | Status: AC
  Filled 2014-01-29: qty 2

## 2014-01-29 MED ORDER — CEFAZOLIN SODIUM-DEXTROSE 2-3 GM-% IV SOLR
2.0000 g | INTRAVENOUS | Status: AC
  Administered 2014-01-29: 2 g via INTRAVENOUS

## 2014-01-29 MED ORDER — MORPHINE SULFATE (PF) 0.5 MG/ML IJ SOLN
INTRAMUSCULAR | Status: DC | PRN
  Administered 2014-01-29: .1 mg via INTRATHECAL

## 2014-01-29 MED ORDER — METOCLOPRAMIDE HCL 5 MG/ML IJ SOLN: 10.0000 mg | Freq: Three times a day (TID) | INTRAMUSCULAR | Status: DC | PRN

## 2014-01-29 MED ORDER — ONDANSETRON HCL 4 MG/2ML IJ SOLN: 4.0000 mg | INTRAMUSCULAR | Status: DC | PRN

## 2014-01-29 MED ORDER — DIPHENHYDRAMINE HCL 50 MG/ML IJ SOLN: 12.5000 mg | INTRAMUSCULAR | Status: DC | PRN

## 2014-01-29 MED ORDER — SCOPOLAMINE 1 MG/3DAYS TD PT72: 1.0000 | MEDICATED_PATCH | Freq: Once | TRANSDERMAL | Status: DC

## 2014-01-29 MED ORDER — SENNOSIDES-DOCUSATE SODIUM 8.6-50 MG PO TABS
2.0000 | ORAL_TABLET | ORAL | Status: DC
  Administered 2014-01-31: 2 via ORAL
  Filled 2014-01-29 (×2): qty 2

## 2014-01-29 MED ORDER — OXYTOCIN 10 UNIT/ML IJ SOLN
INTRAMUSCULAR | Status: AC
  Filled 2014-01-29: qty 4

## 2014-01-29 MED ORDER — BUPIVACAINE IN DEXTROSE 0.75-8.25 % IT SOLN
INTRATHECAL | Status: DC | PRN
  Administered 2014-01-29: 1.5 mL via INTRATHECAL

## 2014-01-29 MED ORDER — CHLOROPROCAINE HCL 3 % IJ SOLN
INTRAMUSCULAR | Status: AC
  Filled 2014-01-29: qty 20

## 2014-01-29 MED ORDER — HYDROCORTISONE 1 % EX CREA
TOPICAL_CREAM | Freq: Four times a day (QID) | CUTANEOUS | Status: DC
  Administered 2014-01-30 (×3): via TOPICAL
  Filled 2014-01-29: qty 28

## 2014-01-29 MED ORDER — SODIUM CHLORIDE 0.9 % IJ SOLN: 3.0000 mL | INTRAMUSCULAR | Status: DC | PRN

## 2014-01-29 MED ORDER — FENTANYL CITRATE 0.05 MG/ML IJ SOLN
INTRAMUSCULAR | Status: DC | PRN
  Administered 2014-01-29: 85 ug via INTRAVENOUS
  Administered 2014-01-29: 15 ug via INTRATHECAL

## 2014-01-29 MED ORDER — ONDANSETRON HCL 4 MG/2ML IJ SOLN: 4.0000 mg | Freq: Three times a day (TID) | INTRAMUSCULAR | Status: DC | PRN

## 2014-01-29 MED ORDER — NALOXONE HCL 0.4 MG/ML IJ SOLN: 0.4000 mg | INTRAMUSCULAR | Status: DC | PRN

## 2014-01-29 MED ORDER — KETOROLAC TROMETHAMINE 30 MG/ML IJ SOLN
INTRAMUSCULAR | Status: AC
  Filled 2014-01-29: qty 1

## 2014-01-29 MED ORDER — CEFAZOLIN SODIUM-DEXTROSE 2-3 GM-% IV SOLR
INTRAVENOUS | Status: AC
Start: 2014-01-29 — End: 2014-01-29
  Filled 2014-01-29: qty 50

## 2014-01-29 MED ORDER — LACTATED RINGERS IV SOLN
INTRAVENOUS | Status: DC
  Administered 2014-01-29: 15:00:00 via INTRAVENOUS

## 2014-01-29 MED ORDER — KETOROLAC TROMETHAMINE 30 MG/ML IJ SOLN
30.0000 mg | Freq: Four times a day (QID) | INTRAMUSCULAR | Status: AC | PRN
  Administered 2014-01-29: 30 mg via INTRAMUSCULAR

## 2014-01-29 MED ORDER — MEASLES, MUMPS & RUBELLA VAC ~~LOC~~ INJ
0.5000 mL | INJECTION | Freq: Once | SUBCUTANEOUS | Status: DC
  Filled 2014-01-29: qty 0.5

## 2014-01-29 MED ORDER — KETOROLAC TROMETHAMINE 30 MG/ML IJ SOLN: 30.0000 mg | Freq: Four times a day (QID) | INTRAMUSCULAR | Status: AC | PRN

## 2014-01-29 MED ORDER — SIMETHICONE 80 MG PO CHEW: 80.0000 mg | CHEWABLE_TABLET | ORAL | Status: DC | PRN

## 2014-01-29 MED ORDER — OXYCODONE-ACETAMINOPHEN 5-325 MG PO TABS
1.0000 | ORAL_TABLET | ORAL | Status: DC | PRN
  Administered 2014-01-29: 1 via ORAL
  Administered 2014-01-30: 2 via ORAL
  Administered 2014-01-30: 1 via ORAL
  Administered 2014-01-30: 2 via ORAL
  Administered 2014-01-30 (×2): 1 via ORAL
  Administered 2014-01-31 (×3): 2 via ORAL
  Filled 2014-01-29 (×2): qty 2
  Filled 2014-01-29: qty 1
  Filled 2014-01-29 (×2): qty 2
  Filled 2014-01-29: qty 1
  Filled 2014-01-29: qty 2

## 2014-01-29 MED ORDER — SIMETHICONE 80 MG PO CHEW
80.0000 mg | CHEWABLE_TABLET | Freq: Three times a day (TID) | ORAL | Status: DC
  Administered 2014-01-29 – 2014-01-31 (×5): 80 mg via ORAL
  Filled 2014-01-29 (×5): qty 1

## 2014-01-29 MED ORDER — PRENATAL MULTIVITAMIN CH
1.0000 | ORAL_TABLET | Freq: Every day | ORAL | Status: DC
  Administered 2014-01-30: 1 via ORAL
  Filled 2014-01-29: qty 1

## 2014-01-29 MED ORDER — DIPHENOXYLATE-ATROPINE 2.5-0.025 MG PO TABS
2.0000 | ORAL_TABLET | Freq: Four times a day (QID) | ORAL | Status: DC | PRN
  Administered 2014-01-29: 2 via ORAL
  Filled 2014-01-29: qty 2

## 2014-01-29 MED ORDER — MEPERIDINE HCL 25 MG/ML IJ SOLN
INTRAMUSCULAR | Status: AC
  Filled 2014-01-29: qty 1

## 2014-01-29 MED ORDER — SCOPOLAMINE 1 MG/3DAYS TD PT72
MEDICATED_PATCH | TRANSDERMAL | Status: AC
  Administered 2014-01-29: 1.5 mg via TRANSDERMAL
  Filled 2014-01-29: qty 1

## 2014-01-29 MED ORDER — MEPERIDINE HCL 25 MG/ML IJ SOLN
INTRAMUSCULAR | Status: DC | PRN
  Administered 2014-01-29: 25 mg via INTRAVENOUS

## 2014-01-29 MED ORDER — PHENYLEPHRINE 8 MG IN D5W 100 ML (0.08MG/ML) PREMIX OPTIME
INJECTION | INTRAVENOUS | Status: DC | PRN
  Administered 2014-01-29: 60 ug/min via INTRAVENOUS

## 2014-01-29 MED ORDER — IBUPROFEN 600 MG PO TABS
600.0000 mg | ORAL_TABLET | Freq: Four times a day (QID) | ORAL | Status: DC
  Administered 2014-01-30 – 2014-01-31 (×4): 600 mg via ORAL
  Filled 2014-01-29 (×4): qty 1

## 2014-01-29 MED ORDER — OXYTOCIN 40 UNITS IN LACTATED RINGERS INFUSION - SIMPLE MED: 62.5000 mL/h | INTRAVENOUS | Status: AC

## 2014-01-29 MED ORDER — DIPHENHYDRAMINE HCL 25 MG PO CAPS: 25.0000 mg | ORAL_CAPSULE | Freq: Four times a day (QID) | ORAL | Status: DC | PRN

## 2014-01-29 MED ORDER — SIMETHICONE 80 MG PO CHEW
80.0000 mg | CHEWABLE_TABLET | ORAL | Status: DC
  Administered 2014-01-29 – 2014-01-31 (×2): 80 mg via ORAL
  Filled 2014-01-29 (×2): qty 1

## 2014-01-29 MED ORDER — MORPHINE SULFATE 0.5 MG/ML IJ SOLN
INTRAMUSCULAR | Status: AC
  Filled 2014-01-29: qty 10

## 2014-01-29 SURGICAL SUPPLY — 30 items
CLAMP CORD UMBIL (MISCELLANEOUS) ×3 IMPLANT
CLOTH BEACON ORANGE TIMEOUT ST (SAFETY) ×3 IMPLANT
DRAPE LG THREE QUARTER DISP (DRAPES) ×3 IMPLANT
DRSG OPSITE POSTOP 4X10 (GAUZE/BANDAGES/DRESSINGS) ×3 IMPLANT
DRSG VASELINE 3X18 (GAUZE/BANDAGES/DRESSINGS) ×3 IMPLANT
DURAPREP 26ML APPLICATOR (WOUND CARE) ×3 IMPLANT
ELECT REM PT RETURN 9FT ADLT (ELECTROSURGICAL) ×3
ELECTRODE REM PT RTRN 9FT ADLT (ELECTROSURGICAL) ×1 IMPLANT
EXTRACTOR VACUUM M CUP 4 TUBE (SUCTIONS) ×2 IMPLANT
EXTRACTOR VACUUM M CUP 4' TUBE (SUCTIONS) ×1
GAUZE VASELINE 3X9 (GAUZE/BANDAGES/DRESSINGS) ×3 IMPLANT
GLOVE BIO SURGEON STRL SZ7.5 (GLOVE) ×3 IMPLANT
GOWN STRL REUS W/TWL LRG LVL3 (GOWN DISPOSABLE) ×9 IMPLANT
NS IRRIG 1000ML POUR BTL (IV SOLUTION) ×3 IMPLANT
PACK C SECTION WH (CUSTOM PROCEDURE TRAY) ×3 IMPLANT
PAD OB MATERNITY 4.3X12.25 (PERSONAL CARE ITEMS) ×3 IMPLANT
RTRCTR C-SECT PINK 25CM LRG (MISCELLANEOUS) ×3 IMPLANT
SPONGE LAP 18X18 X RAY DECT (DISPOSABLE) ×3 IMPLANT
STAPLER VISISTAT 35W (STAPLE) IMPLANT
SUT PLAIN 0 NONE (SUTURE) IMPLANT
SUT VIC AB 0 CT1 36 (SUTURE) ×27 IMPLANT
SUT VIC AB 3-0 CTX 36 (SUTURE) ×3 IMPLANT
SUT VIC AB 3-0 SH 27 (SUTURE) ×2
SUT VIC AB 3-0 SH 27X BRD (SUTURE) ×1 IMPLANT
SUT VIC AB 4-0 KS 27 (SUTURE) IMPLANT
SUT VICRYL 0 TIES 12 18 (SUTURE) IMPLANT
TAPE CLOTH SURG 4X10 WHT LF (GAUZE/BANDAGES/DRESSINGS) ×3 IMPLANT
TOWEL OR 17X24 6PK STRL BLUE (TOWEL DISPOSABLE) ×3 IMPLANT
TRAY FOLEY CATH 14FR (SET/KITS/TRAYS/PACK) ×3 IMPLANT
WATER STERILE IRR 1000ML POUR (IV SOLUTION) ×3 IMPLANT

## 2014-01-29 NOTE — Progress Notes (Signed)
Patient ID: Donney RankinsJamey C Sawka, female   DOB: 1986/02/20, 28 y.o.   MRN: [id number] I examined this lady 01-26-14 and she reports no change in her health since that time.

## 2014-01-29 NOTE — Anesthesia Postprocedure Evaluation (Signed)
Anesthesia Post Note  Patient: Christina RankinsJamey C [name]  Procedure(s) Performed: Procedure(s) (LRB): CESAREAN SECTION (N/A)  Anesthesia type: Spinal  Patient location: Mother/Baby  Post pain: Pain level controlled  Post assessment: Post-op Vital signs reviewed  Last Vitals:  Filed Vitals:   01/29/14 1713  BP: 119/79  Pulse: 74  Temp: 36.4 C  Resp: 16    Post vital signs: Reviewed  Level of consciousness: awake  Complications: No apparent anesthesia complications

## 2014-01-29 NOTE — Anesthesia Postprocedure Evaluation (Signed)
  Anesthesia Post Note  Patient: Christina RankinsJamey C [name]  Procedure(s) Performed: Procedure(s) (LRB): CESAREAN SECTION (N/A)  Anesthesia type: Spinal  Patient location: PACU  Post pain: Pain level controlled  Post assessment: Post-op Vital signs reviewed  Last Vitals:  Filed Vitals:   01/29/14 0907  BP: 134/86  Pulse: 78  Temp: 36.4 C  Resp: 12    Post vital signs: Reviewed  Level of consciousness: awake  Complications: No apparent anesthesia complications

## 2014-01-29 NOTE — Anesthesia Preprocedure Evaluation (Addendum)
Anesthesia Evaluation  Patient identified by MRN, date of birth, ID band Patient awake    Reviewed: Allergy & Precautions, H&P , NPO status , Patient's Chart, lab work & pertinent test results, reviewed documented beta blocker date and time   History of Anesthesia Complications Negative for: history of anesthetic complications  Airway Mallampati: II TM Distance: >3 FB Neck ROM: full    Dental  (+) Teeth Intact   Pulmonary Current Smoker,  breath sounds clear to auscultation        Cardiovascular Exercise Tolerance: Good hypertension (denies), negative cardio ROS  Rhythm:regular Rate:Normal     Neuro/Psych negative neurological ROS  negative psych ROS   GI/Hepatic negative GI ROS, Neg liver ROS,   Endo/Other  negative endocrine ROS  Renal/GU negative Renal ROS     Musculoskeletal   Abdominal   Peds  Hematology negative hematology ROS (+)   Anesthesia Other Findings   Reproductive/Obstetrics (+) Pregnancy (h/o c/s x1, for repeat)                        Anesthesia Physical Anesthesia Plan  ASA: II  Anesthesia Plan: Spinal   Post-op Pain Management:    Induction:   Airway Management Planned:   Additional Equipment:   Intra-op Plan:   Post-operative Plan:   Informed Consent: I have reviewed the patients History and Physical, chart, labs and discussed the procedure including the risks, benefits and alternatives for the proposed anesthesia with the patient or authorized representative who has indicated his/her understanding and acceptance.     Plan Discussed with: Anesthesiologist, CRNA and Surgeon  Anesthesia Plan Comments:        Anesthesia Quick Evaluation

## 2014-01-29 NOTE — Anesthesia Procedure Notes (Signed)
Spinal  Patient location during procedure: OR Start time: 01/29/2014 7:30 AM Staffing Performed by: anesthesiologist  Preanesthetic Checklist Completed: patient identified, site marked, surgical consent, pre-op evaluation, timeout performed, IV checked, risks and benefits discussed and monitors and equipment checked Spinal Block Patient position: sitting Prep: site prepped and draped and DuraPrep Patient monitoring: heart rate, cardiac monitor, continuous pulse ox and blood pressure Approach: midline Location: L3-4 Injection technique: single-shot Needle Needle type: Pencan  Needle gauge: 24 G Needle length: 9 cm Assessment Sensory level: T4 Additional Notes Clear free flow CSF on first attempt.  Transient paresthesia to left hip.  Patient tolerated procedure well with no apparent complications.  Jasmine DecemberA. Cassidy, MD

## 2014-01-29 NOTE — OR Nursing (Signed)
Placenta to OR Regrigerator.

## 2014-01-29 NOTE — Addendum Note (Signed)
Addendum created 01/29/14 1822 by Algis GreenhouseLinda A Burger, CRNA   Modules edited: Notes Section   Notes Section:  File: [id number]

## 2014-01-29 NOTE — Op Note (Signed)
Christina [name]:  Ratto, Karstyn                 ACCOUNT NO.:  [id number]  MEDICAL RECORD NO.:  [id number]  LOCATION:  [facility]                          [facility]  PHYSICIAN:  Malachi Prohomas F. Ambrose MantleHenley, M.D. DATE OF BIRTH:  15-May-1986  DATE OF PROCEDURE:  01/29/2014 DATE OF DISCHARGE:                              OPERATIVE REPORT   PREOPERATIVE DIAGNOSIS:  Intrauterine pregnancy at 39 weeks, for repeat C-section, declined vaginal birth after cesarean.  POSTOPERATIVE DIAGNOSIS:  Intrauterine pregnancy at 39 weeks, for repeat C-section, declined vaginal birth after cesarean with multiple adhesions over the lower part of the anterior uterus including adhesions of the left round ligament to the lower uterus.  OPERATION:  Repeat low-transverse C-section and vacuum assistance to deliver the baby with a T'ing of the uterine incision.  DESCRIPTION OF PROCEDURE:  This patient was brought to the operating room and given a spinal anesthetic by Dr. Rodman Pickleassidy.  She was then placed supine tilted to the left.  A Foley catheter was inserted in the urethra after sterile prepping.  The lower abdomen and mid abdomen.  All the way up to the chest was prepped with DuraPrep, allowed to dry for 3 minutes. The abdomen was then covered with a sterile drape.  An incision was made in the draped to expose the old scar.  After anesthesia was confirmed, I made a transverse incision through the old scar down through the skin and subcutaneous tissue into the fascia.  The fascia was extended laterally and then separated from the rectus muscles superiorly and inferiorly.  I entered the peritoneal cavity without difficulty.  I opened the peritoneal cavity and realized, there was significant adhesions over the lower uterine segment.  These adhesions were divided to get room to work in the lower uterine segment.  After I divided the adhesions, I made a transverse incision through the superficial layers of the myometrium in the lower  uterine segment and entered the amniotic sac with my finger extended the incision with the bandage scissors and then attempted to deliver the vertex, but it did not come out so even with fundal pressure by Dr. Jackelyn KnifeMeisinger did not come out.  I used the vacuum and there was a question of whether we had 3 or 4 pop offs, but after I had 3 pop offs.  I extended the uterine incision about an inch superiorly in a T fashion to try to get more exposure and then by placing my hand underneath the baby's head along with the vacuum.  We got the baby delivered.  The vertex was delivered.  The nose and mouth were suctioned with the bulb.  The rest of the baby was delivered.  The baby cried immediately.  The cord was clamped.  The infant was given to the neonatologist who was in attendance.  Cord blood collection people were there, so the placenta was removed.  The inside of the uterus was free of any debris.  The T part of the incision was closed with a couple of sutures of interrupted figure-of-eight sutures of 0 Vicryl.  The uterine incision was then closed with running suture of 0 Vicryl on the first layer, nonlocking suture of  the same material on the second layer. There was several bleeders that had to be attended with a combination of 3-0 and 0 Vicryl.  It seemed like she had multiple bleeding sites, finally hemostasis was adequate.  Confirmed by liberally irrigating the abdominal cavity several times.  The gutters were blotted free.  The uterus had several small fibroids on the anterior surface  not anymore larger than 1 cm.  The tubes and ovaries appeared normal, and the Alexis retractor which had been used was removed and the abdominal wall was closed in layers using interrupted sutures of 0 Vicryl to close the rectus muscle and peritoneum in 1 layer, 2 running sutures of 0 Vicryl on the fascia, running 3-0 Vicryl on the subcutaneous tissue, and staples on the skin. The patient seemed to tolerate  the procedure well.  Blood loss was about 1000 mL.  Toward the end of the procedure, during the fascia closure the patient complained of pain so we placed some Nesacaine into the abdominal incision and the patient seemed to get good relief.     Malachi Prohomas F. Ambrose MantleHenley, M.D.     TFH/MEDQ  D:  01/29/2014  T:  01/29/2014  Job:  [id number]

## 2014-01-29 NOTE — Lactation Note (Signed)
This note was copied from the chart of Christina [name]. Lactation Consultation Note  Patient Name: Christina [name] [id number]'s Date: 01/29/2014 Reason for consult: Other (Comment) (charting for exclusion)   Maternal Data Formula Feeding for Exclusion: Yes Reason for exclusion: Mother's choice to formula feed on admision  Feeding Feeding Type: Formula Nipple Type: Regular  LATCH Score/Interventions                      Lactation Tools Discussed/Used     Consult Status Consult Status: Complete    Lynda RainwaterBryant, Joanne Parmly 01/29/2014, 4:18 PM

## 2014-01-29 NOTE — Progress Notes (Signed)
Patient ID: Donney RankinsJamey C Langbehn, female   DOB: 1985/10/18, 28 y.o.   MRN: [id number] I was called because of heavier than normal bleeding. I ordered hemabate. When I arrived after 1 hour there had been another pad and 3-5 cc's blood clot. The uterus was firm and exam showed the cervix open a FT and no clots were felt in the uterus

## 2014-01-29 NOTE — Transfer of Care (Signed)
Immediate Anesthesia Transfer of Care Note  Patient: Christina [name]  Procedure(s) Performed: Procedure(s): CESAREAN SECTION (N/A)  Patient Location: PACU  Anesthesia Type:Spinal  Level of Consciousness: awake  Airway & Oxygen Therapy: Patient Spontanous Breathing  Post-op Assessment: Report given to PACU RN  Post vital signs: Reviewed and stable  Complications: No apparent anesthesia complications

## 2014-01-29 NOTE — H&P (Signed)
Christina [name]:  [name], Christina                 ACCOUNT NO.:  [id number]  MEDICAL RECORD NO.:  [id number]  LOCATION:                                 FACILITY:  PHYSICIAN:  Malachi Prohomas F. Ambrose MantleHenley, M.D. DATE OF BIRTH:  1986/03/04  DATE OF ADMISSION:  01/29/2014 DATE OF DISCHARGE:                             HISTORY & PHYSICAL   PRESENT ILLNESS:  This is a 28 year old black female, para 1-0-2-1, gravida 4, EDC, February 05, 2014, admitted for repeat C-section.  She declined a vaginal birth after cesarean.  Blood group and type O positive, negative antibody, hepatitis B surface antigen negative, varicella immune, rubella not immune.  Hemoglobin AA, HIV negative, RPR negative.  Gonorrhea and Chlamydia negative.  Group B strep positive. One hour Glucola 109.  AFB was negative.  She was too far along for evaluation for trisomy 3318 and 21.  She was initially seen at 21 weeks and 5 days.  At  26 weeks, she had tooth pain.  She was advised to see a dentist.  She decided she wanted to repeat C-section.  [place] an hour or so to get here.  Pregnancy seems to have been uncomplicated.  PAST MEDICAL HISTORY:  Reveals that she has had a history of high blood pressure, anemia chicken pox, and allergic rhinitis.  She was tested for cystic fibrosis, in 2009, and it was negative.  SURGICAL HISTORY:  Two early abortions and a C-section, April 19, 2008.  FAMILY HISTORY:  Mother with diabetes, stroke, and high blood pressure. Father with high cholesterol.  He had an MI and high blood pressure. Paternal grandmother, tumor of the lung that was malignant.  Maternal grandfather cancer of the prostate.  SOCIAL HISTORY:  The patient smoked every day at the outside of pregnancy 1/2 pack.  She did not drink.  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  ALLERGIES:  No known drug allergies.  No latex allergy.  PHYSICAL EXAMINATION:  VITAL SIGNS:  Three days prior to  admission, blood pressure 128/74, weight 210 pounds. HEART:  Normal size and sounds.  No murmurs. LUNGS:  Clear to auscultation. PELVIC:  Fundal height 38 cm.  Fetal heart tones 138.  Cervix was not examined.  ADMITTING IMPRESSION:  Intrauterine pregnancy at 39 weeks.  The patient is admitted for repeat C-section.  She declined a vaginal birth after cesarean.  She has been counseled about the risks of surgery and she is ready to proceed.     Malachi Prohomas F. Ambrose MantleHenley, M.D.     TFH/MEDQ  D:  01/28/2014  T:  01/28/2014  Job:  [id number]

## 2014-01-30 LAB — CBC
HCT: 28.3 % — ABNORMAL LOW (ref 36.0–46.0)
Hemoglobin: 9.7 g/dL — ABNORMAL LOW (ref 12.0–15.0)
MCH: 31.1 pg (ref 26.0–34.0)
MCHC: 34.3 g/dL (ref 30.0–36.0)
MCV: 90.7 fL (ref 78.0–100.0)
PLATELETS: 174 10*3/uL (ref 150–400)
RBC: 3.12 MIL/uL — AB (ref 3.87–5.11)
RDW: 14.4 % (ref 11.5–15.5)
WBC: 8.6 10*3/uL (ref 4.0–10.5)

## 2014-01-30 NOTE — Progress Notes (Signed)
Patient ID: Christina RankinsJamey C [name], female   DOB: 01/11/1986, 28 y.o.   MRN: [id number] #1 afebrile Output excellent VS normal HGB 11.8 to 9.7

## 2014-01-31 MED ORDER — IBUPROFEN 600 MG PO TABS: 600.0000 mg | ORAL_TABLET | Freq: Four times a day (QID) | ORAL | Status: AC | PRN

## 2014-01-31 MED ORDER — OXYCODONE-ACETAMINOPHEN 5-325 MG PO TABS
1.0000 | ORAL_TABLET | Freq: Four times a day (QID) | ORAL | Status: DC | PRN
Start: 2014-01-31 — End: 2018-02-06

## 2014-01-31 NOTE — Discharge Instructions (Signed)
booklet °

## 2014-01-31 NOTE — Progress Notes (Signed)
Patient ID: Christina RankinsJamey C [name], female   DOB: 25-Oct-1985, 28 y.o.   MRN: [id number] #2 afebrile BP normal Tolerating a regular diet, passing flatus, ambulating well and voiding well For d/c

## 2014-01-31 NOTE — Discharge Summary (Signed)
NAMLance Sell:  [name], Christina                 ACCOUNT NO.:  [id number]  MEDICAL RECORD NO.:  [id number]  LOCATION:  9103                          [facility]ILITY[facility]  PHYSICIAN:  Malachi Prohomas F. Ambrose MantleHenley, M.D. DATE OF BIRTH:  [birth year]  DATE OF ADMISSION:  01/29/2014 DATE OF DISCHARGE:  01/31/2014                              DISCHARGE SUMMARY   A 28 year old black female, para 1-0-2-1, gravida 4, EDC February 05, 2014, admitted for repeat C-section.  Labs were normal except rubella not immune.  AFP was negative, but she was too far along for trisomy 18 and 21 evaluation when she began her prenatal course.  At the time of the C- section, she was noted to have significant adhesions that had to be divided before we could deliver the baby.  The baby had to be removed with a vacuum assistance, even with that, it was somewhat difficult, required a slight T in the incision and I explained to the patient that she should not have a vaginal delivery in the future.  Her postop hemoglobin was 9.7, hematocrit 28.3, white count 8600, platelet count 174,000.  After surgery, the patient did quite well, remained afebrile, tolerated a regular diet, voided well without difficulty, ambulated well, and was passing flatus.  She was advised to return to the office in 5 days to have her staples removed and she wants her baby circumcised.  FINAL DIAGNOSES:  Intrauterine pregnancy at 39 plus weeks, delivered by C-section.  OPERATION:  Low-transverse cervical C-section with a slight T in the incision and vacuum assistance for the delivery.  FINAL CONDITION:  Improved.  INSTRUCTIONS:  Include our regular discharge instruction booklet as well as the after visit summary.  Percocet 5/325, 30 tablets, 1 every 6 hours as needed for pain and Motrin 600 mg 30 tablets, 1 every 6 hours as needed for pain given at discharge.  She is advised to return to the office in 5 days and she is advised not to have a vaginal delivery in the  future.     Malachi Prohomas F. Ambrose MantleHenley, M.D.     TFH/MEDQ  D:  01/31/2014  T:  01/31/2014  Job:  [id number]

## 2014-02-01 ENCOUNTER — Encounter (HOSPITAL_COMMUNITY): Payer: Self-pay | Admitting: Obstetrics and Gynecology

## 2014-02-01 NOTE — Progress Notes (Signed)
Post discharge chart review completed.  

## 2014-04-01 ENCOUNTER — Other Ambulatory Visit: Payer: Self-pay | Admitting: Obstetrics and Gynecology

## 2014-04-01 DIAGNOSIS — R2231 Localized swelling, mass and lump, right upper limb: Secondary | ICD-10-CM

## 2014-04-06 ENCOUNTER — Other Ambulatory Visit: Payer: Medicaid Other

## 2014-04-09 ENCOUNTER — Ambulatory Visit
Admission: RE | Admit: 2014-04-09 | Discharge: 2014-04-09 | Disposition: A | Discharge status: Home / Self Care | Payer: Medicaid Other | Source: Ambulatory Visit | Attending: Obstetrics and Gynecology | Admitting: Obstetrics and Gynecology

## 2014-04-09 DIAGNOSIS — R2231 Localized swelling, mass and lump, right upper limb: Secondary | ICD-10-CM

## 2014-06-07 ENCOUNTER — Encounter (HOSPITAL_COMMUNITY): Payer: Self-pay | Admitting: Obstetrics and Gynecology

## 2015-05-01 IMAGING — US US OB COMP LESS 14 WK
2 series · 14 of 21 positions shown · non-contrast
Comparison: 08/26/2007.

CLINICAL DATA: Vaginal bleeding. Nine weeks and 5 days pregnant by
last menstrual period.

EXAM:
OBSTETRIC <14 WK ULTRASOUND
TECHNIQUE: Transabdominal ultrasound was performed for evaluation of the
gestation as well as the maternal uterus and adnexal regions.

[Series 1: us ob comp less 14 wks · 5 of 7 slices shown (1 of 2)]
[im 1/7]
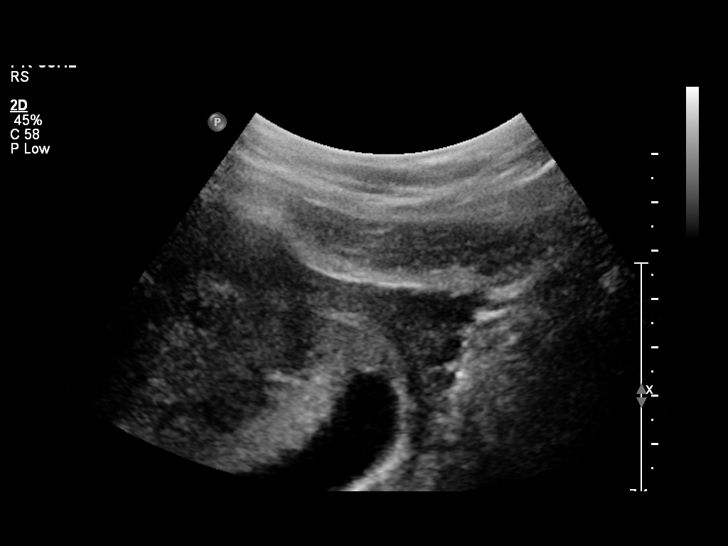
[im 3/7]
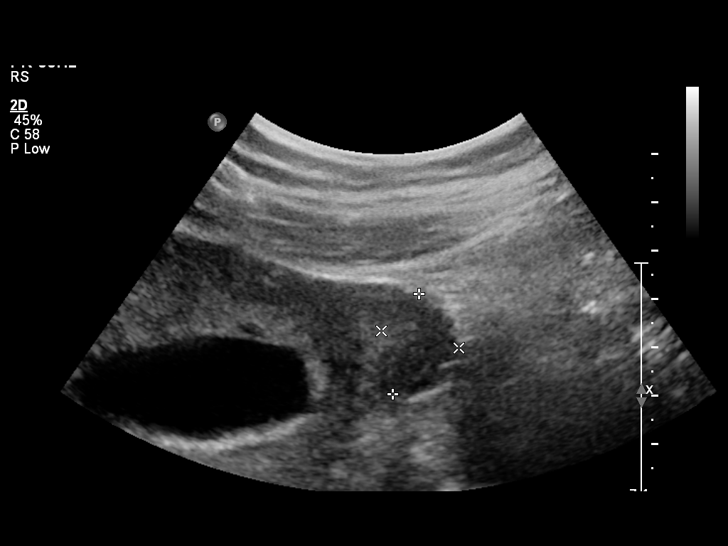
[im 4/7]
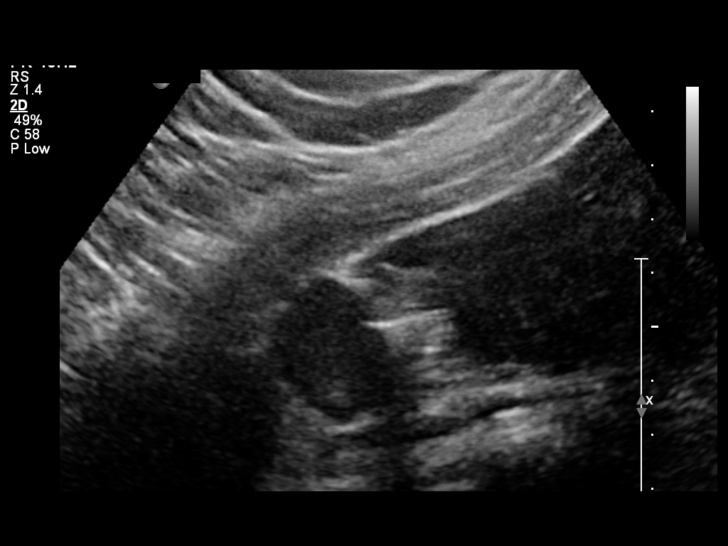
[im 6/7]
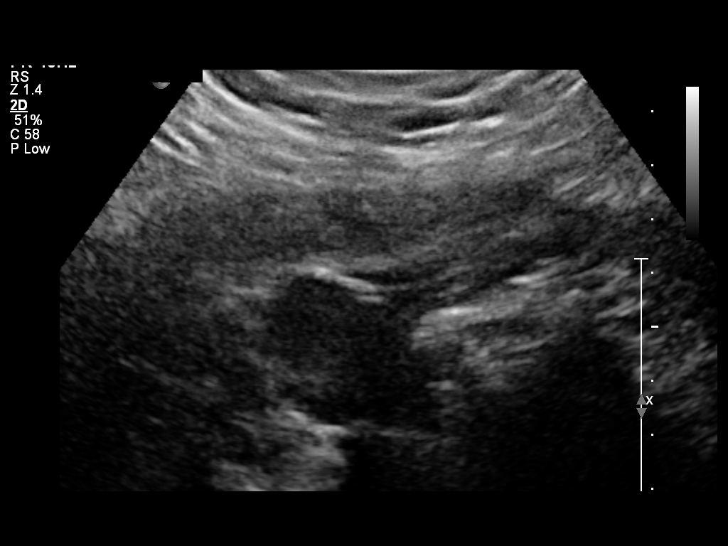
[im 7/7]
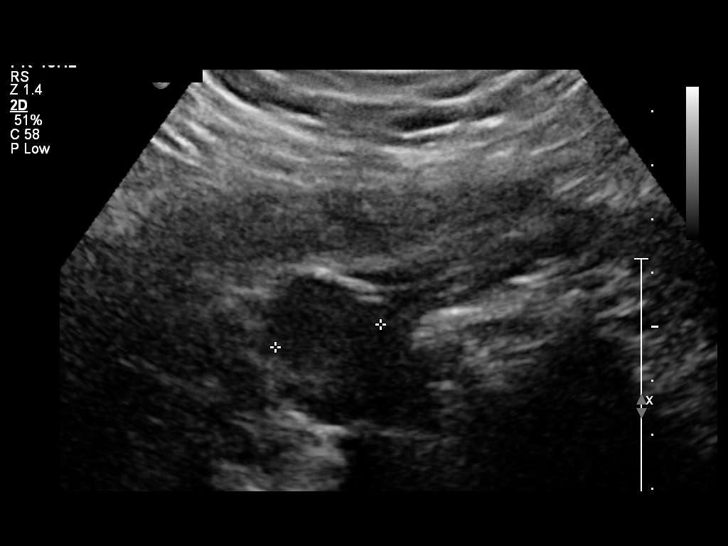

[Series 1: us ob comp less 14 wks · 9 of 14 slices shown (2 of 2)]
[im 2/14]
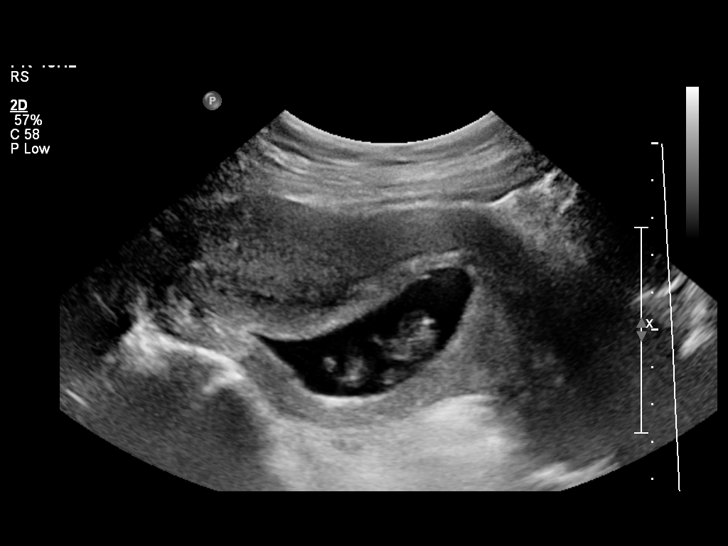
[im 3/14]
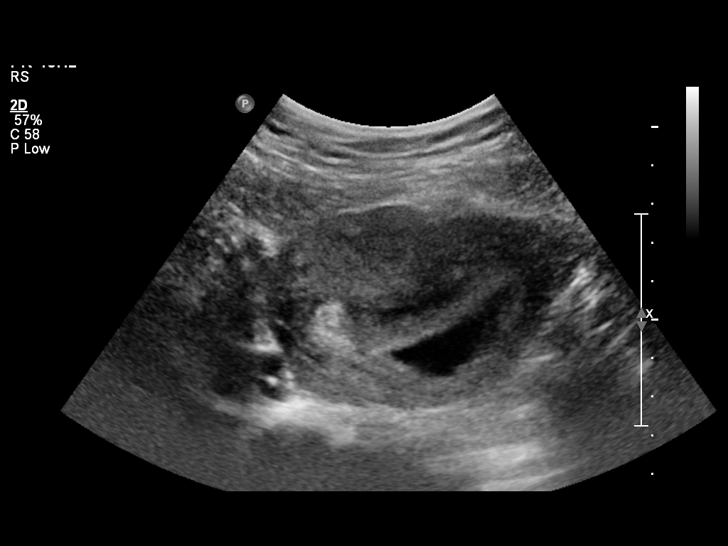
[im 5/14]
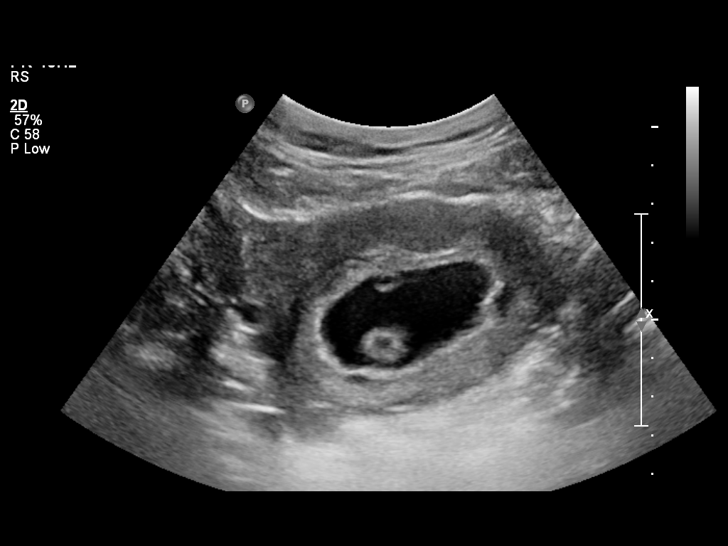
[im 6/14]
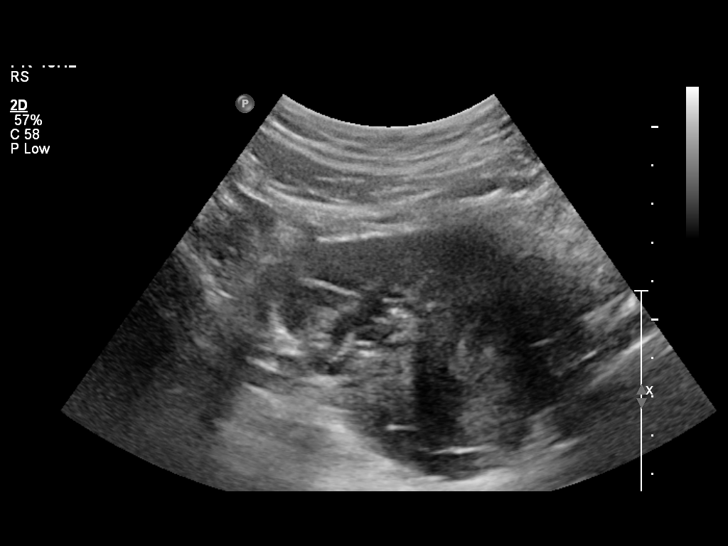
[im 8/14]
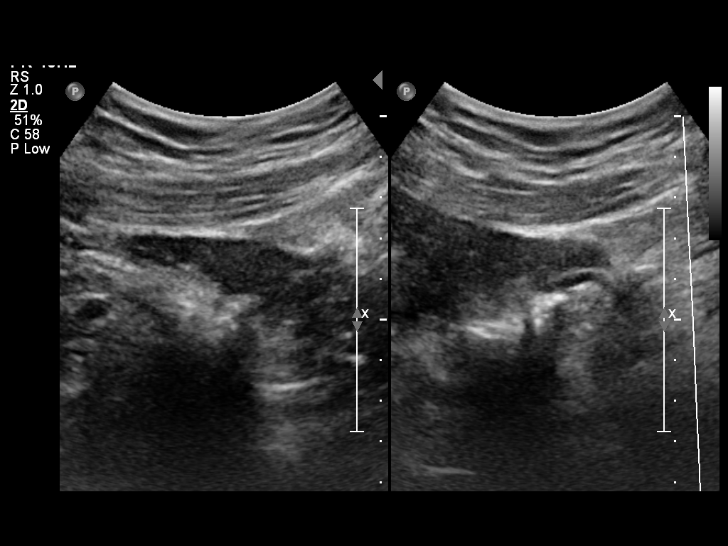
[im 9/14]
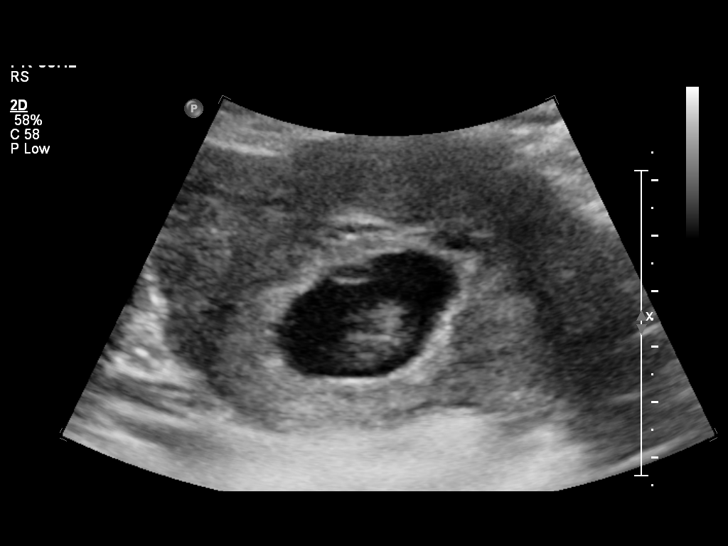
[im 11/14]
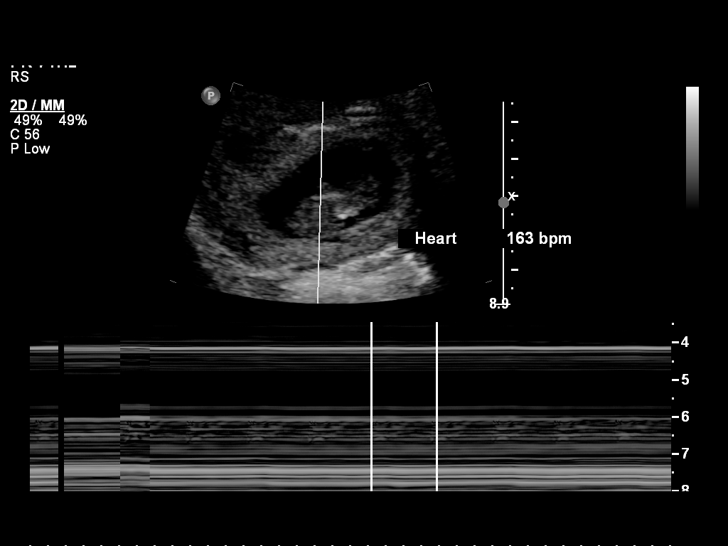
[im 12/14]
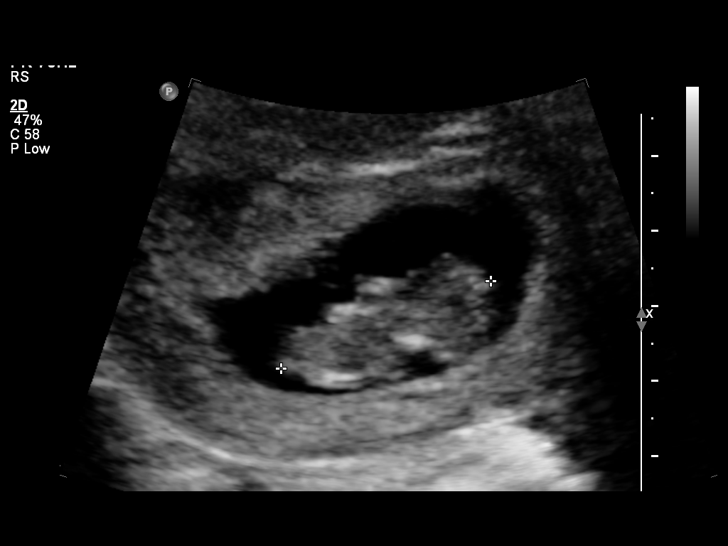
[im 14/14]
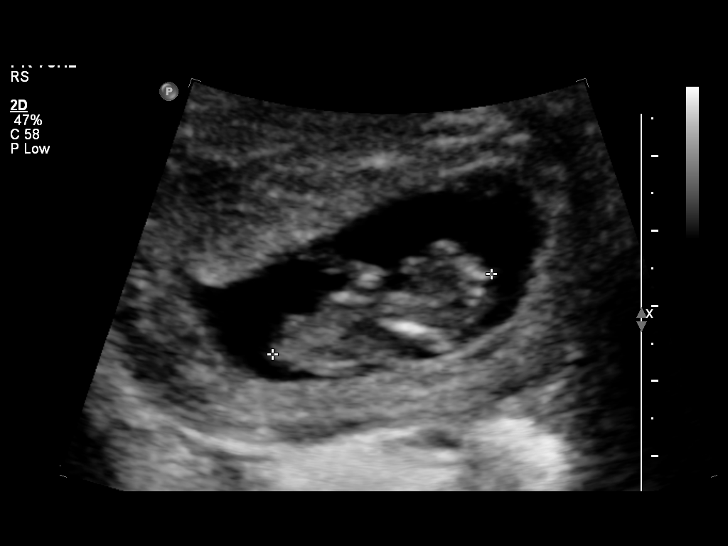

[14 of 21 positions shown; findings below may reference images not displayed]

FINDINGS: Intrauterine gestational sac: Visualized/normal in shape.

Yolk sac:  Visualized/normal in shape

Embryo:  Visualized

Cardiac Activity: Visualized

Heart Rate: 163 Bpm

CRL:   30.7  mm   10 w 0 d                  US EDC: 02/05/2014

Maternal uterus/adnexae: Unremarkable.  No subchorionic hemorrhage.
IMPRESSION: Single live intrauterine gestation with an estimated gestational age
of 10 weeks and 0 days. No complicating features.

## 2018-02-06 ENCOUNTER — Inpatient Hospital Stay (HOSPITAL_COMMUNITY)
Admission: AD | Admit: 2018-02-06 | Discharge: 2018-02-06 | Disposition: A | Discharge status: Home / Self Care | Payer: Self-pay | Source: Ambulatory Visit | Attending: Obstetrics and Gynecology | Admitting: Obstetrics and Gynecology

## 2018-02-06 ENCOUNTER — Encounter (HOSPITAL_COMMUNITY): Payer: Self-pay | Admitting: *Deleted

## 2018-02-06 DIAGNOSIS — B9689 Other specified bacterial agents as the cause of diseases classified elsewhere: Secondary | ICD-10-CM

## 2018-02-06 DIAGNOSIS — F17210 Nicotine dependence, cigarettes, uncomplicated: Secondary | ICD-10-CM | POA: Insufficient documentation

## 2018-02-06 DIAGNOSIS — N939 Abnormal uterine and vaginal bleeding, unspecified: Secondary | ICD-10-CM | POA: Insufficient documentation

## 2018-02-06 DIAGNOSIS — Z975 Presence of (intrauterine) contraceptive device: Secondary | ICD-10-CM

## 2018-02-06 DIAGNOSIS — N760 Acute vaginitis: Secondary | ICD-10-CM | POA: Insufficient documentation

## 2018-02-06 LAB — WET PREP, GENITAL
SPERM: NONE SEEN
TRICH WET PREP: NONE SEEN
YEAST WET PREP: NONE SEEN

## 2018-02-06 LAB — POCT PREGNANCY, URINE: PREG TEST UR: NEGATIVE

## 2018-02-06 MED ORDER — METRONIDAZOLE 500 MG PO TABS: 500.0000 mg | ORAL_TABLET | Freq: Two times a day (BID) | ORAL | 0 refills | Status: AC

## 2018-02-06 NOTE — MAU Provider Note (Signed)
History     CSN: [id number]  Arrival date and time: 02/06/18 1255   First Provider Initiated Contact with Patient 02/06/18 1323      Chief Complaint  Patient presents with  . Vaginal Bleeding   HPI Ms. Christina RankinsJamey C [name] is a 32 y.o. [id number] who presents to MAU today with complaint of spotting since 6/18. The patient has a Nexplanon in place x ~ 4 years. She states that she had regular periods with the Nexplanon until now. Her last normal period was at the end of May. She has noted a few small dark blood clots recently as well. She is sexually active with one partner and does not use protection. She denies fever or UTI symptoms. She has occasional mild cramping when she is spotting, but has not taken anything for pain.   OB History    Gravida  4   Para  2   Term  2   Preterm  0   AB  2   Living  2     SAB  0   TAB  2   Ectopic  0   Multiple  0   Live Births  2           Past Medical History:  Diagnosis Date  . Anemia   . Hypertension    quit medicine a yr ago because of cough  . Termination of pregnancy    x 2    Past Surgical History:  Procedure Laterality Date  . CESAREAN SECTION  04/19/2008   [facility]  . CESAREAN SECTION N/A 01/29/2014   Procedure: CESAREAN SECTION;  Surgeon: Bing Plumehomas F Henley, MD;  Location: [facility];  Service: Obstetrics;  Laterality: N/A;  . THERAPEUTIC ABORTION      Family History  Problem Relation Age of Onset  . Hypertension Mother   . Diabetes Mother   . Hyperlipidemia Father        heart attack  . Hypertension Father   . Cancer Maternal Grandmother        lung  . Cancer Maternal Grandfather        prostate    Social History   Tobacco Use  . Smoking status: Current Every Day Smoker    Packs/day: 0.50    Years: 10.00    Pack years: 5.00    Types: Cigarettes  . Smokeless tobacco: Never Used  Substance Use Topics  . Alcohol use: Yes    Comment: 1-2 a wk prior to preg  . Drug use: No    Allergies: No Known  Allergies  No medications prior to admission.    Review of Systems  Constitutional: Negative for fever.  Gastrointestinal: Negative for abdominal pain, constipation, diarrhea, nausea and vomiting.  Genitourinary: Positive for vaginal bleeding. Negative for dysuria, frequency, urgency and vaginal discharge.   Physical Exam   Blood pressure (!) 149/90, pulse 79, temperature 98.5 F (36.9 C), temperature source Oral, resp. rate 16, height 5\' 5"  (1.651 m), weight 187 lb (84.8 kg), last menstrual period 01/21/2018, SpO2 100 %, unknown if currently breastfeeding.  Physical Exam  Nursing note and vitals reviewed. Constitutional: She is oriented to person, place, and time. She appears well-developed and well-nourished. No distress.  HENT:  Head: Normocephalic and atraumatic.  Cardiovascular: Normal rate.  Respiratory: Effort normal.  GI: Soft. She exhibits no distension and no mass. There is no tenderness. There is no rebound and no guarding.  Neurological: She is alert and oriented to person, place, and time.  Skin: Skin is warm and dry. No erythema.  Psychiatric: She has a normal mood and affect.    Results for orders placed or performed during the hospital encounter of 02/06/18 (from the past 24 hour(s))  Wet prep, genital     Status: Abnormal   Collection Time: 02/06/18  1:31 PM  Result Value Ref Range   Yeast Wet Prep HPF POC NONE SEEN NONE SEEN   Trich, Wet Prep NONE SEEN NONE SEEN   Clue Cells Wet Prep HPF POC PRESENT (A) NONE SEEN   WBC, Wet Prep HPF POC FEW (A) NONE SEEN   Sperm NONE SEEN   Pregnancy, urine POC     Status: None   Collection Time: 02/06/18  2:03 PM  Result Value Ref Range   Preg Test, Ur NEGATIVE NEGATIVE    MAU Course  Procedures None  MDM UPT  Wet prep, GC/chlamydia today   Assessment and Plan  A: Nexplanon expired  Spotting  Bacterial vaginosis   P:  Discharge home Rx for Flagyl given to patient  Bleeding precautions discussed Patient  advised to follow-up with [facility], [facility] or [facility] for removal of expired Nexplanon due to lack of insurance at this time Patient counseled on safe sex practices given Nexplanon is unlikely effective at this time.  Patient may return to MAU as needed or if her condition were to change or worsen  Vonzella Nipple, PA-C 02/06/2018, 2:51 PM

## 2018-02-06 NOTE — MAU Note (Signed)
Urine in lab 

## 2018-02-06 NOTE — MAU Note (Signed)
Pt reports her birth control (implant) has been a year and she has been spotting for the last month. Also reports lower back pain off/on for 2 weeks.

## 2018-02-06 NOTE — Discharge Instructions (Signed)

## 2018-02-07 LAB — GC/CHLAMYDIA PROBE AMP (~~LOC~~) NOT AT ARMC
Chlamydia: NEGATIVE
NEISSERIA GONORRHEA: NEGATIVE
